# Patient Record
Sex: Male | Born: 1970 | Race: Black or African American | Hispanic: No | Marital: Single | Smoking: Never smoker
Health system: Southern US, Community
[De-identification: ages and names within clinical notes are randomized; demographics above are authoritative.]

## PROBLEM LIST (undated history)

## (undated) DIAGNOSIS — S82899A Other fracture of unspecified lower leg, initial encounter for closed fracture: Secondary | ICD-10-CM

## (undated) DIAGNOSIS — F32A Depression, unspecified: Secondary | ICD-10-CM

## (undated) DIAGNOSIS — I10 Essential (primary) hypertension: Secondary | ICD-10-CM

## (undated) DIAGNOSIS — K649 Unspecified hemorrhoids: Secondary | ICD-10-CM

## (undated) DIAGNOSIS — F329 Major depressive disorder, single episode, unspecified: Secondary | ICD-10-CM

## (undated) DIAGNOSIS — F419 Anxiety disorder, unspecified: Secondary | ICD-10-CM

## (undated) DIAGNOSIS — J45909 Unspecified asthma, uncomplicated: Secondary | ICD-10-CM

## (undated) DIAGNOSIS — J40 Bronchitis, not specified as acute or chronic: Secondary | ICD-10-CM

## (undated) DIAGNOSIS — K219 Gastro-esophageal reflux disease without esophagitis: Secondary | ICD-10-CM

## (undated) DIAGNOSIS — K5900 Constipation, unspecified: Secondary | ICD-10-CM

## (undated) HISTORY — PX: ANKLE SURGERY: SHX546

---
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
## 2007-07-16 ENCOUNTER — Emergency Department (HOSPITAL_COMMUNITY): Admission: EM | Admit: 2007-07-16 | Discharge: 2007-07-16 | Payer: Self-pay | Admitting: Emergency Medicine

## 2007-10-06 ENCOUNTER — Ambulatory Visit: Payer: Self-pay | Admitting: *Deleted

## 2007-10-06 ENCOUNTER — Ambulatory Visit: Payer: Self-pay | Admitting: Internal Medicine

## 2007-12-29 ENCOUNTER — Emergency Department (HOSPITAL_COMMUNITY): Admission: EM | Admit: 2007-12-29 | Discharge: 2007-12-29 | Payer: Self-pay | Admitting: Emergency Medicine

## 2008-01-03 ENCOUNTER — Ambulatory Visit: Payer: Self-pay | Admitting: Internal Medicine

## 2008-01-03 LAB — CONVERTED CEMR LAB
ALT: 28 units/L (ref 0–53)
AST: 20 units/L (ref 0–37)
BUN: 11 mg/dL (ref 6–23)
Basophils Relative: 1 % (ref 0–1)
CO2: 24 meq/L (ref 19–32)
Chloride: 107 meq/L (ref 96–112)
Eosinophils Absolute: 0.1 10*3/uL (ref 0.0–0.7)
Eosinophils Relative: 1 % (ref 0–5)
Glucose, Bld: 98 mg/dL (ref 70–99)
HCT: 45.2 % (ref 39.0–52.0)
HDL: 44 mg/dL (ref >39)
Hemoglobin: 14.8 g/dL (ref 13.0–17.0)
LDL Cholesterol: 124 mg/dL — ABNORMAL HIGH (ref 0–99)
Neutrophils Relative %: 64 % (ref 43–77)
Potassium: 4.5 meq/L (ref 3.5–5.3)
RBC: 5.01 M/uL (ref 4.22–5.81)
WBC: 6.7 10*3/uL (ref 4.0–10.5)

## 2008-02-29 ENCOUNTER — Ambulatory Visit: Payer: Self-pay | Admitting: Internal Medicine

## 2008-03-20 ENCOUNTER — Emergency Department (HOSPITAL_COMMUNITY): Admission: EM | Admit: 2008-03-20 | Discharge: 2008-03-20 | Payer: Self-pay | Admitting: Emergency Medicine

## 2008-05-05 ENCOUNTER — Emergency Department (HOSPITAL_COMMUNITY): Admission: EM | Admit: 2008-05-05 | Discharge: 2008-05-05 | Payer: Self-pay | Admitting: Emergency Medicine

## 2008-09-26 ENCOUNTER — Emergency Department (HOSPITAL_COMMUNITY): Admission: EM | Admit: 2008-09-26 | Discharge: 2008-09-26 | Payer: Self-pay | Admitting: Emergency Medicine

## 2009-03-20 ENCOUNTER — Ambulatory Visit: Payer: Self-pay | Admitting: Family Medicine

## 2009-03-20 ENCOUNTER — Encounter: Payer: Self-pay | Admitting: Internal Medicine

## 2009-03-20 DIAGNOSIS — K649 Unspecified hemorrhoids: Secondary | ICD-10-CM | POA: Insufficient documentation

## 2009-03-20 DIAGNOSIS — J309 Allergic rhinitis, unspecified: Secondary | ICD-10-CM | POA: Insufficient documentation

## 2009-05-22 ENCOUNTER — Encounter (INDEPENDENT_AMBULATORY_CARE_PROVIDER_SITE_OTHER): Payer: Self-pay | Admitting: *Deleted

## 2009-05-22 ENCOUNTER — Emergency Department (HOSPITAL_COMMUNITY): Admission: EM | Admit: 2009-05-22 | Discharge: 2009-05-23 | Payer: Self-pay | Admitting: Emergency Medicine

## 2009-05-23 ENCOUNTER — Encounter (INDEPENDENT_AMBULATORY_CARE_PROVIDER_SITE_OTHER): Payer: Self-pay | Admitting: *Deleted

## 2009-06-16 ENCOUNTER — Encounter (INDEPENDENT_AMBULATORY_CARE_PROVIDER_SITE_OTHER): Payer: Self-pay | Admitting: *Deleted

## 2009-06-20 ENCOUNTER — Ambulatory Visit: Payer: Self-pay | Admitting: Gastroenterology

## 2009-06-23 ENCOUNTER — Encounter: Payer: Self-pay | Admitting: Gastroenterology

## 2009-06-23 ENCOUNTER — Ambulatory Visit: Payer: Self-pay | Admitting: Gastroenterology

## 2009-06-26 ENCOUNTER — Encounter: Payer: Self-pay | Admitting: Gastroenterology

## 2010-01-02 ENCOUNTER — Encounter: Admission: RE | Admit: 2010-01-02 | Discharge: 2010-01-02 | Payer: Self-pay | Admitting: General Surgery

## 2010-01-29 ENCOUNTER — Ambulatory Visit (HOSPITAL_COMMUNITY): Admission: RE | Admit: 2010-01-29 | Discharge: 2010-01-29 | Payer: Self-pay | Admitting: Gastroenterology

## 2010-02-27 ENCOUNTER — Ambulatory Visit (HOSPITAL_COMMUNITY): Admission: RE | Admit: 2010-02-27 | Discharge: 2010-02-27 | Payer: Self-pay | Admitting: Gastroenterology

## 2010-05-04 ENCOUNTER — Emergency Department (HOSPITAL_COMMUNITY): Admission: EM | Admit: 2010-05-04 | Discharge: 2010-05-04 | Payer: Self-pay | Admitting: Emergency Medicine

## 2011-02-08 LAB — DIFFERENTIAL
Basophils Relative: 1 % (ref 0–1)
Eosinophils Relative: 2 % (ref 0–5)
Lymphs Abs: 2.9 10*3/uL (ref 0.7–4.0)
Monocytes Absolute: 0.7 10*3/uL (ref 0.1–1.0)
Monocytes Relative: 7 % (ref 3–12)

## 2011-02-08 LAB — BASIC METABOLIC PANEL
Creatinine, Ser: 1 mg/dL (ref 0.4–1.5)
GFR calc non Af Amer: >60 mL/min (ref >60)
Glucose, Bld: 86 mg/dL (ref 70–99)
Potassium: 3.9 mEq/L (ref 3.5–5.1)
Sodium: 142 mEq/L (ref 135–145)

## 2011-02-08 LAB — HEMOCCULT GUIAC POC 1CARD (OFFICE): Fecal Occult Bld: NEGATIVE

## 2011-02-08 LAB — CBC
MCV: 92.6 fL (ref 78.0–100.0)
RBC: 4.33 MIL/uL (ref 4.22–5.81)

## 2011-02-28 LAB — COMPREHENSIVE METABOLIC PANEL
AST: 46 U/L — ABNORMAL HIGH (ref 0–37)
Alkaline Phosphatase: 54 U/L (ref 39–117)
Chloride: 107 mEq/L (ref 96–112)
GFR calc non Af Amer: >60 mL/min (ref >60)
Potassium: 3.6 mEq/L (ref 3.5–5.1)

## 2011-02-28 LAB — URINALYSIS, ROUTINE W REFLEX MICROSCOPIC
Bilirubin Urine: NEGATIVE
Urobilinogen, UA: 0.2 mg/dL (ref 0.0–1.0)
pH: 6 (ref 5.0–8.0)

## 2011-02-28 LAB — CBC
HCT: 39.5 % (ref 39.0–52.0)
Hemoglobin: 13.4 g/dL (ref 13.0–17.0)
MCV: 92 fL (ref 78.0–100.0)
Platelets: 265 10*3/uL (ref 150–400)
RBC: 4.3 MIL/uL (ref 4.22–5.81)
RDW: 11.7 % (ref 11.5–15.5)

## 2011-02-28 LAB — POCT I-STAT, CHEM 8
Calcium, Ion: 1.19 mmol/L (ref 1.12–1.32)
Chloride: 106 mEq/L (ref 96–112)
Creatinine, Ser: 1.2 mg/dL (ref 0.4–1.5)
Glucose, Bld: 87 mg/dL (ref 70–99)
HCT: 42 % (ref 39.0–52.0)
Hemoglobin: 14.3 g/dL (ref 13.0–17.0)
Sodium: 145 mEq/L (ref 135–145)
TCO2: 27 mmol/L (ref 0–100)

## 2011-02-28 LAB — DIFFERENTIAL
Basophils Absolute: 0 10*3/uL (ref 0.0–0.1)
Lymphocytes Relative: 29 % (ref 12–46)
Monocytes Relative: 8 % (ref 3–12)
Neutro Abs: 4.6 10*3/uL (ref 1.7–7.7)

## 2011-03-22 ENCOUNTER — Emergency Department (HOSPITAL_COMMUNITY)
Admission: EM | Admit: 2011-03-22 | Discharge: 2011-03-22 | Disposition: A | Discharge status: Home / Self Care | Payer: Self-pay | Attending: Emergency Medicine | Admitting: Emergency Medicine

## 2011-03-22 DIAGNOSIS — N419 Inflammatory disease of prostate, unspecified: Secondary | ICD-10-CM | POA: Insufficient documentation

## 2011-06-13 ENCOUNTER — Emergency Department (HOSPITAL_COMMUNITY)
Admission: EM | Admit: 2011-06-13 | Discharge: 2011-06-14 | Disposition: A | Discharge status: Home / Self Care | Payer: Self-pay | Attending: Emergency Medicine | Admitting: Emergency Medicine

## 2011-06-13 DIAGNOSIS — E669 Obesity, unspecified: Secondary | ICD-10-CM | POA: Insufficient documentation

## 2011-06-13 DIAGNOSIS — H6120 Impacted cerumen, unspecified ear: Secondary | ICD-10-CM | POA: Insufficient documentation

## 2011-06-13 DIAGNOSIS — K219 Gastro-esophageal reflux disease without esophagitis: Secondary | ICD-10-CM | POA: Insufficient documentation

## 2011-06-13 DIAGNOSIS — I10 Essential (primary) hypertension: Secondary | ICD-10-CM | POA: Insufficient documentation

## 2011-06-13 DIAGNOSIS — J45909 Unspecified asthma, uncomplicated: Secondary | ICD-10-CM | POA: Insufficient documentation

## 2011-06-13 DIAGNOSIS — R21 Rash and other nonspecific skin eruption: Secondary | ICD-10-CM | POA: Insufficient documentation

## 2011-06-14 LAB — URINALYSIS, ROUTINE W REFLEX MICROSCOPIC
Bilirubin Urine: NEGATIVE
Glucose, UA: NEGATIVE mg/dL
Hgb urine dipstick: NEGATIVE
Specific Gravity, Urine: 1.031 — ABNORMAL HIGH (ref 1.005–1.030)
pH: 6 (ref 5.0–8.0)

## 2011-07-27 ENCOUNTER — Emergency Department (HOSPITAL_COMMUNITY)
Admission: EM | Admit: 2011-07-27 | Discharge: 2011-07-28 | Disposition: A | Discharge status: Home / Self Care | Payer: Self-pay | Attending: Emergency Medicine | Admitting: Emergency Medicine

## 2011-07-27 DIAGNOSIS — L298 Other pruritus: Secondary | ICD-10-CM | POA: Insufficient documentation

## 2011-07-27 DIAGNOSIS — F411 Generalized anxiety disorder: Secondary | ICD-10-CM | POA: Insufficient documentation

## 2011-07-27 DIAGNOSIS — L2989 Other pruritus: Secondary | ICD-10-CM | POA: Insufficient documentation

## 2011-07-27 DIAGNOSIS — K219 Gastro-esophageal reflux disease without esophagitis: Secondary | ICD-10-CM | POA: Insufficient documentation

## 2011-07-27 DIAGNOSIS — I10 Essential (primary) hypertension: Secondary | ICD-10-CM | POA: Insufficient documentation

## 2011-07-27 DIAGNOSIS — B354 Tinea corporis: Secondary | ICD-10-CM | POA: Insufficient documentation

## 2011-08-17 LAB — URINALYSIS, ROUTINE W REFLEX MICROSCOPIC
Glucose, UA: NEGATIVE
Hgb urine dipstick: NEGATIVE
Nitrite: NEGATIVE
Protein, ur: NEGATIVE
Specific Gravity, Urine: 1.024
Urobilinogen, UA: 0.2

## 2011-08-17 LAB — GC/CHLAMYDIA PROBE AMP, GENITAL
Chlamydia, DNA Probe: NEGATIVE
GC Probe Amp, Genital: NEGATIVE

## 2011-08-17 LAB — RPR: RPR Ser Ql: NONREACTIVE

## 2011-08-24 LAB — URINALYSIS, ROUTINE W REFLEX MICROSCOPIC
Hgb urine dipstick: NEGATIVE
Nitrite: NEGATIVE
Specific Gravity, Urine: 1.028
Urobilinogen, UA: 1

## 2011-08-24 LAB — DIFFERENTIAL
Basophils Absolute: 0.1
Lymphocytes Relative: 24
Lymphs Abs: 2.4
Monocytes Absolute: 1
Monocytes Relative: 10
Neutro Abs: 6.3

## 2011-08-24 LAB — COMPREHENSIVE METABOLIC PANEL
ALT: 23
BUN: 9
CO2: 27
Calcium: 9.2
Creatinine, Ser: 1.06
Glucose, Bld: 93
Potassium: 4.2
Sodium: 138
Total Bilirubin: 0.5
Total Protein: 6.7

## 2011-08-24 LAB — CBC: Hemoglobin: 14.5

## 2011-09-03 LAB — COMPREHENSIVE METABOLIC PANEL
AST: 26
Alkaline Phosphatase: 65
CO2: 30
Chloride: 104
Creatinine, Ser: 1.13
GFR calc Af Amer: >60
GFR calc non Af Amer: >60
Potassium: 3.9
Total Bilirubin: 0.6

## 2011-09-03 LAB — TSH: TSH: 0.624

## 2011-09-03 LAB — CBC
HCT: 42.2
MCV: 88.1
RBC: 4.79
WBC: 9.6

## 2011-09-03 LAB — DIFFERENTIAL
Basophils Absolute: 0.2 — ABNORMAL HIGH
Basophils Relative: 2 — ABNORMAL HIGH
Eosinophils Absolute: 0.2
Eosinophils Relative: 2
Lymphocytes Relative: 25

## 2011-10-29 ENCOUNTER — Emergency Department (HOSPITAL_COMMUNITY)
Admission: EM | Admit: 2011-10-29 | Discharge: 2011-10-29 | Disposition: A | Discharge status: Home / Self Care | Payer: Self-pay | Attending: Emergency Medicine | Admitting: Emergency Medicine

## 2011-10-29 ENCOUNTER — Encounter: Payer: Self-pay | Admitting: Physical Medicine and Rehabilitation

## 2011-10-29 DIAGNOSIS — R159 Full incontinence of feces: Secondary | ICD-10-CM | POA: Insufficient documentation

## 2011-10-29 DIAGNOSIS — R6889 Other general symptoms and signs: Secondary | ICD-10-CM | POA: Insufficient documentation

## 2011-10-29 DIAGNOSIS — R195 Other fecal abnormalities: Secondary | ICD-10-CM | POA: Insufficient documentation

## 2011-10-29 DIAGNOSIS — R109 Unspecified abdominal pain: Secondary | ICD-10-CM | POA: Insufficient documentation

## 2011-10-29 LAB — URINALYSIS, ROUTINE W REFLEX MICROSCOPIC
Bilirubin Urine: NEGATIVE
Ketones, ur: NEGATIVE mg/dL
Nitrite: NEGATIVE
Protein, ur: NEGATIVE mg/dL
Specific Gravity, Urine: 1.029 (ref 1.005–1.030)
Urobilinogen, UA: 1 mg/dL (ref 0.0–1.0)

## 2011-10-29 NOTE — ED Notes (Signed)
Pt presents to department for evaluation of abdominal pain, N/V and diarrhea. Pt states chronic problems with same. 10/10 abdominal cramping at the time. No blood in stool. Pt states pain and difficulty with defecation. Pt also reports decreased PO intake. Pt is conscious alert and oriented x4.

## 2011-10-29 NOTE — ED Notes (Addendum)
Pt had a POS hemoccult card, NOT pos preg test. Verified by Delila Pereyra, phlebotomist

## 2011-10-29 NOTE — ED Provider Notes (Signed)
History     CSN: [id number] Arrival date & time: 10/29/2011  5:32 PM   First MD Initiated Contact with Patient 10/29/11 2032      No chief complaint on file.   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  States he has intermittent abdominal cramping.  States his stool is dark black, "like charcoal," that smells strange. Pt states he has been seen by [facility] for this problem and has been given the name of a GI doctor but has just gotten insurance and has thus not yet been able to follow up.  Denies any changes in his chronic symptoms.  Denies fevers, N/V.  Has tried gold bond powder and baby wipes without improvement.  States the smell is interfering with his job.  Pt denies lightheadedness, SOB, weakness.    The history is provided by the patient.    History reviewed. No pertinent past medical history.  History reviewed. No pertinent past surgical history.  History reviewed. No pertinent family history.  History  Substance Use Topics  . Smoking status: Never Smoker   . Smokeless tobacco: Not on file  . Alcohol Use: No      Review of Systems  All other systems reviewed and are negative.    Allergies  Review of patient's allergies indicates no known allergies.  Home Medications  No current outpatient prescriptions on file.  BP 146/91  Pulse 58  Temp(Src) 98.1 F (36.7 C) (Oral)  Resp 17  SpO2 97%  Physical Exam  Nursing note and vitals reviewed. Constitutional: He is oriented to person, place, and time. He appears well-developed and well-nourished.  HENT:  Head: Normocephalic and atraumatic.  Neck: Neck supple.  Cardiovascular: Normal rate, regular rhythm and normal heart sounds.   Pulmonary/Chest: Breath sounds normal. No respiratory distress. He has no wheezes. He has no rales. He exhibits no tenderness.    Abdominal: Soft. Bowel sounds are normal. He exhibits no distension and no mass. There is tenderness. There is no rebound and no guarding.         obese  Genitourinary: Rectal exam shows no mass, no tenderness and anal tone normal. Guaiac positive stool.       Stool in rectal vault is green, liquid.  No frank blood or melanic stool.   Musculoskeletal:       Spine nontender.  Strength 5/5 lower extremities, sensation in lower extremities intact.    Neurological: He is alert and oriented to person, place, and time.    ED Course  Procedures (including critical care time)  Hemoccult positive, recorded erroneously in chart as positive pregnancy test.    Discussed patient with Dr Weldon Inches.  Patient with chronic, unchanged symptoms, no symptoms of symptomatic anemia.    Labs Reviewed  URINALYSIS, ROUTINE W REFLEX MICROSCOPIC  POCT PREGNANCY, URINE  POCT OCCULT BLOOD STOOL, DEVICE   No results found.   1. Occult GI bleeding       MDM  Patient with chronic, unchanged symptoms of rectal leaking and foul smelling stools x 2-3 years.  Patient's stool is hemoccult positive but without signs or symptoms of anemia.  Patient has been seen by [facility] many times for this previously but has not yet seen a specialist.  Have given GI follow up, reasons for immediate return.  Pt verbalizes understanding.          Rise Patience, [place] 10/29/11 2316

## 2011-10-29 NOTE — ED Notes (Addendum)
Pt reports diarrhea-like "leakage" from anus x2 years - pt states his employer told him his co-workers do not want to be around him due to the smell and needed to see a physician about his condition. Pt presents tonight w/ mild lower abd tenderness, worse w/ palpation. Pt denies recent n/v however admits to x1 episode "a few days ago" - pt reports diarrhea appears "dark grey color." Pt denies any anal intercourse.

## 2011-10-30 NOTE — ED Provider Notes (Signed)
Medical screening examination/treatment/procedure(s) were performed by non-physician practitioner and as supervising physician I was immediately available for consultation/collaboration.  Evert Wenrich P Giuseppina Quinones, MD 10/30/11 1509 

## 2012-01-18 ENCOUNTER — Other Ambulatory Visit: Payer: Self-pay | Admitting: Gastroenterology

## 2012-02-17 ENCOUNTER — Encounter (HOSPITAL_COMMUNITY): Payer: Self-pay

## 2012-02-17 ENCOUNTER — Emergency Department (HOSPITAL_COMMUNITY): Payer: BC Managed Care – PPO

## 2012-02-17 ENCOUNTER — Emergency Department (HOSPITAL_COMMUNITY)
Admission: EM | Admit: 2012-02-17 | Discharge: 2012-02-17 | Disposition: A | Discharge status: Home / Self Care | Payer: BC Managed Care – PPO | Attending: Emergency Medicine | Admitting: Emergency Medicine

## 2012-02-17 DIAGNOSIS — J020 Streptococcal pharyngitis: Secondary | ICD-10-CM

## 2012-02-17 DIAGNOSIS — B353 Tinea pedis: Secondary | ICD-10-CM | POA: Insufficient documentation

## 2012-02-17 DIAGNOSIS — R0981 Nasal congestion: Secondary | ICD-10-CM

## 2012-02-17 DIAGNOSIS — R05 Cough: Secondary | ICD-10-CM | POA: Insufficient documentation

## 2012-02-17 DIAGNOSIS — L22 Diaper dermatitis: Secondary | ICD-10-CM | POA: Insufficient documentation

## 2012-02-17 DIAGNOSIS — J3489 Other specified disorders of nose and nasal sinuses: Secondary | ICD-10-CM | POA: Insufficient documentation

## 2012-02-17 DIAGNOSIS — R059 Cough, unspecified: Secondary | ICD-10-CM | POA: Insufficient documentation

## 2012-02-17 DIAGNOSIS — R0982 Postnasal drip: Secondary | ICD-10-CM | POA: Insufficient documentation

## 2012-02-17 DIAGNOSIS — J069 Acute upper respiratory infection, unspecified: Secondary | ICD-10-CM

## 2012-02-17 MED ORDER — ALBUTEROL SULFATE HFA 108 (90 BASE) MCG/ACT IN AERS: 2.0000 | INHALATION_SPRAY | RESPIRATORY_TRACT | Status: DC | PRN

## 2012-02-17 MED ORDER — PENICILLIN G BENZATHINE 1200000 UNIT/2ML IM SUSP
1.2000 10*6.[IU] | INTRAMUSCULAR | Status: AC
  Administered 2012-02-17: 1.2 10*6.[IU] via INTRAMUSCULAR
  Filled 2012-02-17 (×2): qty 2

## 2012-02-17 MED ORDER — CETIRIZINE-PSEUDOEPHEDRINE ER 5-120 MG PO TB12: 1.0000 | ORAL_TABLET | Freq: Two times a day (BID) | ORAL | Status: DC | PRN

## 2012-02-17 MED ORDER — DERMAGRAN BC EX CREA: TOPICAL_CREAM | CUTANEOUS | Status: DC | PRN

## 2012-02-17 MED ORDER — MICONAZOLE NITRATE 2 % EX CREA: TOPICAL_CREAM | Freq: Two times a day (BID) | CUTANEOUS | Status: DC

## 2012-02-17 MED ORDER — HYDROCODONE-ACETAMINOPHEN 5-325 MG PO TABS: 2.0000 | ORAL_TABLET | ORAL | Status: AC | PRN

## 2012-02-17 NOTE — Discharge Instructions (Signed)
Athlete's Foot  Athlete's foot is a skin infection caused by a fungus. Athlete's foot is often seen between or under the toes. It can also be seen on the bottom of the foot. Athlete's foot can spread to other people by sharing towels or shower stalls. HOME CARE  Only take medicines as told by your doctor. Do not use steroid creams.   Wash your feet daily. Dry your feet well, especially between the toes.   Change your socks every day. Wear cotton or wool socks.   Change your socks 2 to 3 times a day in hot weather.   Wear sandals or canvas tennis shoes with good airflow.   If you have blisters, soak your feet in a solution as told by your doctor. Do this for 20 to 30 minutes, 2 times a day. Dry your feet well after you soak them.   Do not share towels.   Wear sandals when you use shared locker rooms or showers.  GET HELP RIGHT AWAY IF:   You have a fever.   Your foot is puffy (swollen), sore, warm, or red.   You are not getting better after 7 days of treatment.   You still have athlete's foot after 30 days.   You have problems caused by your medicine.  MAKE SURE YOU:   Understand these instructions.   Will watch your condition.   Will get help right away if you are not doing well or get worse.  Document Released: 04/26/2008 Document Revised: 10/28/2011 Document Reviewed: 08/27/2011 [facility] Patient Information 2012 [place], [place].Contact Dermatitis Contact dermatitis is a rash that happens when something touches the skin. You touched something that irritates your skin, or you have allergies to something you touched. HOME CARE   Avoid the thing that caused your rash.   Keep your rash away from hot water, soap, sunlight, chemicals, and other things that might bother it.   Do not scratch your rash.   You can take cool baths to help stop itching.   Only take medicine as told by your doctor.   Keep all doctor visits as told.  GET HELP RIGHT AWAY IF:   Your rash is  not better after 3 days.   Your rash gets worse.   Your rash is puffy (swollen), tender, red, sore, or warm.   You have problems with your medicine.  MAKE SURE YOU:   Understand these instructions.   Will watch your condition.   Will get help right away if you are not doing well or get worse.  Document Released: 09/05/2009 Document Revised: 10/28/2011 Document Reviewed: 04/13/2011 [facility] Patient Information 2012 [place], [place].Diaper Rash Your caregiver has diagnosed your baby as having diaper rash. CAUSES  Diaper rash can have a number of causes. The baby's bottom is often wet, so the skin there becomes soft and damaged. It is more susceptible to inflammation (irritation) and infections. This process is caused by the constant contact with:  Urine.   Fecal material.   Retained diaper soap.   Yeast.   Germs (bacteria).  TREATMENT   If the rash has been diagnosed as a recurrent yeast infection (monilia), an antifungal agent such as Monistat cream will be useful.   If the caregiver decides the rash is caused by a yeast or bacterial (germ) infection, he may prescribe an appropriate ointment or cream. If this is the case today:   Use the cream or ointment 3 times per day, unless otherwise directed.   Change the diaper  whenever the baby is wet or soiled.   Leaving the diaper off for brief periods of time will also help.  HOME CARE INSTRUCTIONS  Most diaper rash responds readily to simple measures.   Just changing the diapers frequently will allow the skin to become healthier.   Using more absorbent diapers will keep the baby's bottom dryer.   Each diaper change should be accompanied by washing the baby's bottom with warm soapy water. Dry it thoroughly. Make sure no soap remains on the skin.   Over the counter ointments such as A&D, petrolatum and zinc oxide paste may also prove useful. Ointments, if available, are generally less irritating than creams. Creams may  produce a burning feeling when applied to irritated skin.  SEEK MEDICAL CARE IF:  The rash has not improved in 2 to 3 days, or if the rash gets worse. You should make an appointment to see your baby's caregiver. SEEK IMMEDIATE MEDICAL CARE IF:  A fever develops over 100.4 F (38.0 C) or as your caregiver suggests. MAKE SURE YOU:   Understand these instructions.   Will watch your condition.   Will get help right away if you are not doing well or get worse.  Document Released: 11/05/2000 Document Revised: 10/28/2011 Document Reviewed: 06/13/2008 [facility] Patient Information 2012 [place], [place].Cough, Adult  A cough is a reflex. It helps you clear your throat and airways. A cough can help heal your body. A cough can last 2 or 3 weeks (acute) or may last more than 8 weeks (chronic). Some common causes of a cough can include an infection, allergy, or a cold. HOME CARE  Only take medicine as told by your doctor.   If given, take your medicines (antibiotics) as told. Finish them even if you start to feel better.   Use a cold steam vaporizer or humidier in your home. This can help loosen thick spit (secretions).   Sleep so you are almost sitting up (semi-upright). Use pillows to do this. This helps reduce coughing.   Rest as needed.   Stop smoking if you smoke.  GET HELP RIGHT AWAY IF:  You have yellowish-white fluid (pus) in your thick spit.   Your cough gets worse.   Your medicine does not reduce coughing, and you are losing sleep.   You cough up blood.   You have trouble breathing.   Your pain gets worse and medicine does not help.   You have a fever.  MAKE SURE YOU:   Understand these instructions.   Will watch your condition.   Will get help right away if you are not doing well or get worse.  Document Released: 07/22/2011 Document Revised: 10/28/2011 Document Reviewed: 07/22/2011 [facility] Patient Information 2012 [place], [place].Fungus Infection of the Skin An  infection of your skin caused by a fungus is a very common problem. Treatment depends on which part of the body is affected. Types of fungal skin infection include:  Athlete's Foot(Tinea pedis). This infection starts between the toes and may involve the entire sole and sides of foot. It is the most common fungal disease. It is made worse by heat, moisture, and friction. To treat, wash your feet 2 to 3 times daily. Dry thoroughly between the toes. Use medicated foot powder or cream as directed on the package. Plain talc, cornstarch, or rice powder may be dusted into socks and shoes to keep the feet dry. Wearing footwear that allows ventilation is also helpful.   Ringworm (Tinea corporis and tinea capitis). This  infection causes scaly red rings to form on the skin or scalp. For skin sores, apply medicated lotion or cream as directed on the package. For the scalp, medicated shampoo may be used with with other therapies. Ringworm of the scalp or fingernails usually requires using oral medicine for 2 to 4 months.   Tinea versicolor. This infection appears as painless, scaly, patchy areas of discolored skin (whitish to light brown). It is more common in the summer and favors oily areas of the skin such as those found at the chest, abdomen, back, pubis, neck, and body folds. It can be treated with medicated shampoo or with medicated topical cream. Oral antifungals may be needed for more active infections. The light and/or dark spots may take time to get better and is not a sign of treatment failure.  Fungal infections may need to be treated for several weeks to be cured. It is important not to treat fungal infections with steroids or combination medicine that contains an antifungal and steroid as these will make the fungal infection worse. SEEK MEDICAL CARE IF:   You have persistent itching or rawness.   You have an oral temperature above 102 F (38.9 C).  Document Released: 12/16/2004 Document Revised:  10/28/2011 Document Reviewed: 03/03/2010 [facility] Patient Information 2012 [place], [place].Pharyngitis, Viral and Bacterial Pharyngitis is soreness (inflammation) or infection of the pharynx. It is also called a sore throat. CAUSES  Most sore throats are caused by viruses and are part of a cold. However, some sore throats are caused by strep and other bacteria. Sore throats can also be caused by post nasal drip from draining sinuses, allergies and sometimes from sleeping with an open mouth. Infectious sore throats can be spread from person to person by coughing, sneezing and sharing cups or eating utensils. TREATMENT  Sore throats that are viral usually last 3-4 days. Viral illness will get better without medications (antibiotics). Strep throat and other bacterial infections will usually begin to get better about 24-48 hours after you begin to take antibiotics. HOME CARE INSTRUCTIONS   If the caregiver feels there is a bacterial infection or if there is a positive strep test, they will prescribe an antibiotic. The full course of antibiotics must be taken. If the full course of antibiotic is not taken, you or your child may become ill again. If you or your child has strep throat and do not finish all of the medication, serious heart or kidney diseases may develop.   Drink enough water and fluids to keep your urine clear or pale yellow.   Only take over-the-counter or prescription medicines for pain, discomfort or fever as directed by your caregiver.   Get lots of rest.   Gargle with salt water ( tsp. of salt in a glass of water) as often as every 1-2 hours as you need for comfort.   Hard candies may soothe the throat if individual is not at risk for choking. Throat sprays or lozenges may also be used.  SEEK MEDICAL CARE IF:   Large, tender lumps in the neck develop.   A rash develops.   Green, yellow-brown or bloody sputum is coughed up.   Your baby is older than 3 months with a rectal  temperature of 100.5 F (38.1 C) or higher for more than 1 day.  SEEK IMMEDIATE MEDICAL CARE IF:   A stiff neck develops.   You or your child are drooling or unable to swallow liquids.   You or your child are vomiting,  unable to keep medications or liquids down.   You or your child has severe pain, unrelieved with recommended medications.   You or your child are having difficulty breathing (not due to stuffy nose).   You or your child are unable to fully open your mouth.   You or your child develop redness, swelling, or severe pain anywhere on the neck.   You have a fever.   Your baby is older than 3 months with a rectal temperature of 102 F (38.9 C) or higher.   Your baby is 4 months old or younger with a rectal temperature of 100.4 F (38 C) or higher.  MAKE SURE YOU:   Understand these instructions.   Will watch your condition.   Will get help right away if you are not doing well or get worse.  Document Released: 11/08/2005 Document Revised: 10/28/2011 Document Reviewed: 02/05/2008 [facility] Patient Information 2012 [place], [place].Saline Nose Drops  To help clear a stuffy nose, put salt water (saline) nose drops in your infant's nose. This helps to loosen the secretions in the nose. Use a bulb syringe to clean the nose out:  Before feeding.   Before putting your infant down for naps.   No more than once every 3 hours to avoid irritating your infant's nostrils.  HOME CARE  Buy nose drops at your local drug store. You can also make nose drops yourself. Mix 1 cup of water with  teaspoon of salt. Stir. Store this mixture at room temperature. Make a new batch daily.   To use the drops:   Put 1 or 2 drops in each side of infant's nose with a clean medicine dropper. Do not use this dropper for any other medicine.   Squeeze the air out of the suction bulb before inserting it into your infant's nose.   While still squeezing the bulb flat, place the tip of the bulb into  a nostril. Let air come back into the bulb. The suction will pull snot out of the nose and into the bulb.   Repeat on other nostril.   Squeeze the bulb several times into a tissue and wash the bulb tip in soapy water. Store the bulb with the tip side down on paper towel.   Use the bulb syringe with only the saline drops to avoid irritating your infant's nostrils.  GET HELP RIGHT AWAY IF:  The snot changes to green or yellow.   The snot gets thicker.   Your infant is 3 months or younger with a rectal temperature of 100.4 F (38 C) or higher.   Your infant is older than 3 months with a rectal temperature of 102 F (38.9 C) or higher.   The stuffy nose lasts 10 days or longer.   There is trouble breathing or feeding.  MAKE SURE YOU:  Understand these instructions.   Will watch your infant's condition.   Will get help right away if your infant is not doing well or gets worse.  Document Released: 09/05/2009 Document Revised: 10/28/2011 Document Reviewed: 09/05/2009 [facility] Patient Information 2012 [place], [place].Salt Water Gargle This solution will help make your mouth and throat feel better. HOME CARE INSTRUCTIONS   Mix 1 teaspoon of salt in 8 ounces of warm water.   Gargle with this solution as much or often as you need or as directed. Swish and gargle gently if you have any sores or wounds in your mouth.   Do not swallow this mixture.  Document Released: 08/12/2004 Document  Revised: 10/28/2011 Document Reviewed: 01/03/2009 [facility] Patient Information 2012 [place], [place].Strep Throat Strep throat is an infection of the throat. It is caused by a germ. Strep throat spreads from person to person by coughing, sneezing, or close contact. HOME CARE  Rinse your mouth (gargle) with warm salt water (1 teaspoon salt in 1 cup of water). Do this 3 to 4 times per day or as needed for comfort.   Family members with a sore throat or fever should see a doctor.   Make sure everyone in  your house washes their hands well.   Do not share food, drinking cups, or personal items.   Eat soft foods until your sore throat gets better.   Drink enough water and fluids to keep your pee (urine) clear or pale yellow.   Rest.   Stay home from school, daycare, or work until you have taken medicine for 24 hours.   Only take medicine as told by your doctor.   Take your medicine as told. Finish it even if you start to feel better.  GET HELP RIGHT AWAY IF:   You have new problems, such as throwing up (vomiting) or bad headaches.   You have a stiff or painful neck, chest pain, trouble breathing, or trouble swallowing.   You have very bad throat pain, drooling, or changes in your voice.   Your neck puffs up (swells) or gets red and tender.   You have a fever.   You are very tired, your mouth is dry, or you are peeing less than normal.   You cannot wake up completely.   You get a rash, cough, or earache.   You have green, yellow-brown, or bloody spit.   Your pain does not get better with medicine.  MAKE SURE YOU:   Understand these instructions.   Will watch your condition.   Will get help right away if you are not doing well or get worse.  Document Released: 04/26/2008 Document Revised: 10/28/2011 Document Reviewed: 01/07/2011  RESOURCE GUIDE  Dental Problems  Patients with Medicaid: [facility] [phone] [address]                                           [phone] [address] Phone:  [phone]                                                  Phone:  [phone]  If unable to pay or uninsured, contact:  [facility] or [facility] [place]. to become qualified for the adult dental clinic.  Chronic Pain Problems Contact [facility]  [phone] Patients need to be referred by their primary care doctor.  Insufficient Money for Medicine Contact [employer]:  call "211" or [facility] [phone].  No Primary Care Doctor Call [facility]  [phone] Other agencies that provide inexpensive medical care    [facility]  [phone]    [facility]  [phone]    [facility]  [phone]    [facility]  [phone]    [facility]  [facility]  [phone]    [facility]  [phone]  Psychological Services [facility]  [phone] [facility]  [phone] [facility]   [phone] (emergency services [phone])  Substance Abuse Resources [facility]  ([phone] [facility] [phone] [facility] [place] [phone] Floydene Flock [phone] Residential & Outpatient Substance Abuse Program  [phone]  Abuse/Neglect [facility] Child Abuse Hotline (808)537-8107 [facility] Child Abuse Hotline 8562273194 (After Hours)  Emergency Shelter [facility] [phone]  Maternity Homes [facility] [place] [facility] [phone] [facility] 9284109442  MRSA Hotline #:   539-497-5167    [facility] Resources  [facility] [place]     [employer]                          [facility] Dept. [address] [place]                       [address]      [address]  Blondell Reveal Phone:  [phone]                                   Phone:  [phone]                 Phone:  [phone]  [facility] Phone:  [phone]  [facility] Child Abuse Hotline 2263733083 6208636892 (After Hours)   [facility] Patient Information 2012 [place], [place].

## 2012-02-17 NOTE — ED Provider Notes (Signed)
History     CSN: [id number]  Arrival date & time 02/17/12  1426   First MD Initiated Contact with Patient 02/17/12 1530      Chief Complaint  Patient presents with  . Abdominal Pain    (Consider location/radiation/quality/duration/timing/severity/associated sxs/prior treatment) HPI Comments: Upper respiratory tract infection:  The patient is a 41 year old male who presents for evaluation of several days (approximately 5) of nasal congestion, rhinorrhea, purulent nasal discharge, postnasal drip, sore throat , cough, reportedly productive of yellow sputum. The sore throat is moderate in intensity. He has no lymphadenopathy, no dysphonia, no difficulty breathing or swallowing.  He is handling his secretions well without any drooling. He denies any rash, ear pain, vision change, eye pain, neck pain or stiffness. He reports subjective fever and chills. He denies chest pain or dyspnea and has had no wheezing. The patient does have a history of asthma.  Skin problem:  The patient reports 3-4 days of irritation within the gluteal cleft and the folds of skin between the buttocks that is made worse with wiping his bottom after a bowel movement.  He also reports pruritus, thickening and erythema of skin between his toes on both feet. This has been going on for several weeks.  Constipation/Abdominal pain:  Despite the documentation by the triage nurse, the patient is not having abdominal pain and had a bowel movement just several minutes ago, this morning, while awaiting triage in the emergency department. He denies diarrhea, denies any blood or mucus in the stool.  The history is provided by the patient.    Past Medical History  Diagnosis Date  . Arthritis   . Seasonal allergies     History reviewed. No pertinent past surgical history.  No family history on file.  History  Substance Use Topics  . Smoking status: Never Smoker   . Smokeless tobacco: Not on file  . Alcohol Use: No       Review of Systems  Constitutional: Positive for fever, chills and fatigue. Negative for diaphoresis, activity change, appetite change and unexpected weight change.  HENT: Positive for congestion, sore throat, rhinorrhea and postnasal drip. Negative for hearing loss, ear pain, nosebleeds, facial swelling, sneezing, drooling, mouth sores, trouble swallowing, neck pain, neck stiffness, dental problem, voice change, sinus pressure, tinnitus and ear discharge.   Eyes: Negative for photophobia, pain, discharge, redness and itching.  Respiratory: Positive for cough. Negative for choking, chest tightness, shortness of breath, wheezing and stridor.   Cardiovascular: Negative for chest pain, palpitations and leg swelling.  Gastrointestinal: Negative for nausea, vomiting, abdominal pain, diarrhea, constipation, blood in stool, abdominal distention and anal bleeding.  Genitourinary: Negative for dysuria, urgency, frequency, hematuria, flank pain and difficulty urinating.  Musculoskeletal: Negative for myalgias, back pain, joint swelling, arthralgias and gait problem.  Skin: Positive for rash. Negative for color change, pallor and wound.  Neurological: Negative for dizziness, weakness, light-headedness and headaches.  Hematological: Negative for adenopathy. Does not bruise/bleed easily.  Psychiatric/Behavioral: Negative.     Allergies  Review of patient's allergies indicates no known allergies.  Home Medications   Current Outpatient Rx  Name Route Sig Dispense Refill  . ALBUTEROL SULFATE HFA 108 (90 BASE) MCG/ACT IN AERS Inhalation Inhale 2 puffs into the lungs every 6 (six) hours as needed.    Marland Kitchen OVER THE COUNTER MEDICATION Oral Take 1 capsule by mouth daily. stool softner    . OVER THE COUNTER MEDICATION Oral Take 1 tablet by mouth 2 (two) times daily. Over the counter  Diet pill    . OVER THE COUNTER MEDICATION Oral Take 1 tablet by mouth daily. Over the counter allergy medication      BP  133/62  Pulse 91  Temp 98.4 F (36.9 C)  Resp 20  SpO2 97%  Physical Exam  Nursing note and vitals reviewed. Constitutional: He is oriented to person, place, and time. He appears well-developed and well-nourished. He appears distressed.  HENT:  Head: Normocephalic and atraumatic. Head is without right periorbital erythema and without left periorbital erythema.  Right Ear: Hearing, tympanic membrane, external ear and ear canal normal.  Left Ear: Hearing, tympanic membrane, external ear and ear canal normal.  Nose: Mucosal edema and rhinorrhea present. Right sinus exhibits no maxillary sinus tenderness and no frontal sinus tenderness. Left sinus exhibits no maxillary sinus tenderness and no frontal sinus tenderness.  Mouth/Throat: Uvula is midline and mucous membranes are normal. No oral lesions. Posterior oropharyngeal erythema present. No oropharyngeal exudate, posterior oropharyngeal edema or tonsillar abscesses.  Eyes: Conjunctivae and EOM are normal.  Neck: Normal range of motion. No JVD present. No tracheal deviation present.  Cardiovascular: Normal rate, regular rhythm, normal heart sounds and intact distal pulses.  Exam reveals no gallop and no friction rub.   No murmur heard. Pulmonary/Chest: Effort normal and breath sounds normal. No respiratory distress. He has no wheezes. He has no rales. He exhibits no tenderness.  Abdominal: Soft. Bowel sounds are normal. He exhibits no distension. There is no tenderness. There is no rebound and no guarding.  Musculoskeletal: Normal range of motion. He exhibits no edema and no tenderness.  Lymphadenopathy:    He has no cervical adenopathy.  Neurological: He is alert and oriented to person, place, and time. He has normal reflexes. No cranial nerve deficit. He exhibits normal muscle tone. Coordination normal.  Skin: Skin is warm and dry. Rash noted. He is not diaphoretic. There is erythema. No pallor.       The patient has mild erythema and  induration of the skin between the buttocks in the superior gluteal cleft consistent with diaper dermatitis, or contact dermatitis. I instructed the patient about proper hygiene after bowel movements and with bathing, and will recommend a zinc oxide ointment. There is no apparent cellulitis or abscess here.  Between the patient's toes on the dorsum of the feet there is mild erythema, lichenification and thickening of the skin, it is dry, without purpura, blisters, or weeping, and the skin is intact. This appears compatible with athlete's foot or tinea pedis.  Psychiatric: He has a normal mood and affect. His behavior is normal. Judgment and thought content normal.    ED Course  Procedures (including critical care time)  Labs Reviewed  RAPID STREP SCREEN - Abnormal; Notable for the following:    Streptococcus, Group A Screen (Direct) POSITIVE (*)    All other components within normal limits   Dg Chest 2 View  02/17/2012  *RADIOLOGY REPORT*  Clinical Data: Productive cough.  Evaluate for pneumonia.  CHEST - 2 VIEW  Comparison: None  Findings:  The heart size and mediastinal contours are within normal limits.  Both lungs are clear.  The visualized skeletal structures are unremarkable.  IMPRESSION: No active cardiopulmonary disease.  Original Report Authenticated By: Elsie Stain, M.D.     No diagnosis found.    MDM  The patient appears to have a viral upper respiratory tract infection with nasal congestion and postnasal drip, and his cough is thought likely due to to post  nasal drip as his chest x-ray is normal and his lungs are clear to auscultation without any dyspnea or hypoxia. He is not having any wheezing or signs of asthma exacerbation. The patient surprisingly does, positive for streptococcal pharyngitis. I have treated him in the emergency department with a one-time injection of penicillin IM. Otherwise, he appears to have contact dermatitis of the gluteal cleft, and athlete's foot or  tinea pedis. I will treat accordingly.        Felisa Bonier, MD 02/17/12 [phone]

## 2012-02-17 NOTE — ED Notes (Signed)
Pt ambulatory to x-ray at this tiime.

## 2012-02-17 NOTE — ED Notes (Signed)
Pt c/o congestion, sore throat and feeling cold for 3 days.   Nausea without vomiting

## 2012-02-17 NOTE — ED Notes (Signed)
Pt presents with multiple complaints.  Pt reports nasal congestion and productive cough with yellow phlegm x 3 days.  +sore throat.  Pt reports abdominal cramping, reports no bowel movement x 4 days, but is able to pass gas.  Pt reports rash to perineal area x 4 days.  -nausea

## 2012-03-07 ENCOUNTER — Encounter (HOSPITAL_COMMUNITY): Payer: Self-pay | Admitting: Emergency Medicine

## 2012-03-07 ENCOUNTER — Emergency Department (HOSPITAL_COMMUNITY)
Admission: EM | Admit: 2012-03-07 | Discharge: 2012-03-07 | Disposition: A | Discharge status: Home / Self Care | Payer: BC Managed Care – PPO | Attending: Emergency Medicine | Admitting: Emergency Medicine

## 2012-03-07 DIAGNOSIS — J309 Allergic rhinitis, unspecified: Secondary | ICD-10-CM | POA: Insufficient documentation

## 2012-03-07 DIAGNOSIS — R05 Cough: Secondary | ICD-10-CM | POA: Insufficient documentation

## 2012-03-07 DIAGNOSIS — I10 Essential (primary) hypertension: Secondary | ICD-10-CM | POA: Insufficient documentation

## 2012-03-07 DIAGNOSIS — R6889 Other general symptoms and signs: Secondary | ICD-10-CM | POA: Insufficient documentation

## 2012-03-07 DIAGNOSIS — M129 Arthropathy, unspecified: Secondary | ICD-10-CM | POA: Insufficient documentation

## 2012-03-07 DIAGNOSIS — R059 Cough, unspecified: Secondary | ICD-10-CM | POA: Insufficient documentation

## 2012-03-07 DIAGNOSIS — K5900 Constipation, unspecified: Secondary | ICD-10-CM | POA: Insufficient documentation

## 2012-03-07 HISTORY — DX: Essential (primary) hypertension: I10

## 2012-03-07 NOTE — Discharge Instructions (Signed)
Constipation in Adults Constipation is having fewer than 2 bowel movements per week. Usually, the stools are hard. As we grow older, constipation is more common. If you try to fix constipation with laxatives, the problem may get worse. This is because laxatives taken over a long period of time make the colon muscles weaker. A low-fiber diet, not taking in enough fluids, and taking some medicines may make these problems worse. MEDICATIONS THAT MAY CAUSE CONSTIPATION  Water pills (diuretics).   Calcium channel blockers (used to control blood pressure and for the heart).   Certain pain medicines (narcotics).   Anticholinergics.   Anti-inflammatory agents.   Antacids that contain aluminum.  DISEASES THAT CONTRIBUTE TO CONSTIPATION  Diabetes.   Parkinson's disease.   Dementia.   Stroke.   Depression.   Illnesses that cause problems with salt and water metabolism.  HOME CARE INSTRUCTIONS   Constipation is usually best cared for without medicines. Increasing dietary fiber and eating more fruits and vegetables is the best way to manage constipation.   Slowly increase fiber intake to 25 to 38 grams per day. Whole grains, fruits, vegetables, and legumes are good sources of fiber. A dietitian can further help you incorporate high-fiber foods into your diet.   Drink enough water and fluids to keep your urine clear or pale yellow.   A fiber supplement may be added to your diet if you cannot get enough fiber from foods.   Increasing your activities also helps improve regularity.   Suppositories, as suggested by your caregiver, will also help. If you are using antacids, such as aluminum or calcium containing products, it will be helpful to switch to products containing magnesium if your caregiver says it is okay.   If you have been given a liquid injection (enema) today, this is only a temporary measure. It should not be relied on for treatment of longstanding (chronic) constipation.    Stronger measures, such as magnesium sulfate, should be avoided if possible. This may cause uncontrollable diarrhea. Using magnesium sulfate may not allow you time to make it to the bathroom.  SEEK IMMEDIATE MEDICAL CARE IF:   There is bright red blood in the stool.   The constipation stays for more than 4 days.   There is belly (abdominal) or rectal pain.   You do not seem to be getting better.   You have any questions or concerns.  MAKE SURE YOU:   Understand these instructions.   Will watch your condition.   Will get help right away if you are not doing well or get worse.  Document Released: 08/06/2004 Document Revised: 10/28/2011 Document Reviewed: 10/12/2011 [facility] Patient Information 2012 [place], [place].   Tried milk of magnesia as directed for constipation Call [facility] tomorrow to get a primary care Doctor

## 2012-03-07 NOTE — ED Provider Notes (Addendum)
History     CSN: [id number]  Arrival date & time 03/07/12  1618   First MD Initiated Contact with Patient 03/07/12 1830      Chief Complaint  Patient presents with  . Rectal Pain  . Dental Pain    (Consider location/radiation/quality/duration/timing/severity/associated sxs/prior treatment) HPI Complains of hard bowel movements for several months also complains of bad breath for several months denies dental pain. No treatment prior to coming here has pain with bowel movements at penis no other complaint no anorexia no bowel pain no fever last bowel movement approximately 5 days ago which was hard no blood per rectum no other complaint Past Medical History  Diagnosis Date  . Arthritis   . Seasonal allergies   . Hypertension     History reviewed. No pertinent past surgical history.  History reviewed. No pertinent family history.  History  Substance Use Topics  . Smoking status: Never Smoker   . Smokeless tobacco: Not on file  . Alcohol Use: No      Review of Systems  HENT:       Halitosis  Gastrointestinal: Positive for constipation.  All other systems reviewed and are negative.    Allergies  Review of patient's allergies indicates no known allergies.  Home Medications   Current Outpatient Rx  Name Route Sig Dispense Refill  . ALBUTEROL SULFATE HFA 108 (90 BASE) MCG/ACT IN AERS Inhalation Inhale 2 puffs into the lungs every 4 (four) hours as needed. For wheezing    . PRAMOXINE HCL 1 % RE OINT Rectal Place 1 application rectally every 2 (two) hours as needed. For hemorrhoids    . DISPOSABLE ENEMA 19-7 GM/118ML RE ENEM Rectal Place 1 enema rectally once. follow package directions      BP 133/85  Pulse 94  Temp(Src) 98.1 F (36.7 C) (Oral)  Resp 16  SpO2 97%  Physical Exam  Nursing note and vitals reviewed. Constitutional: He appears well-developed and well-nourished.  HENT:  Head: Normocephalic and atraumatic.  Right Ear: External ear normal.  Left  Ear: External ear normal.  Mouth/Throat: Oropharynx is clear and moist.       No halitosis  Eyes: Conjunctivae are normal. Pupils are equal, round, and reactive to light.  Neck: Neck supple. No tracheal deviation present. No thyromegaly present.  Cardiovascular: Normal rate and regular rhythm.   No murmur heard. Pulmonary/Chest: Effort normal and breath sounds normal.  Abdominal: Soft. Bowel sounds are normal. He exhibits no distension. There is no tenderness.       Obese  Genitourinary: Rectum normal and prostate normal. Guaiac negative stool.  Musculoskeletal: Normal range of motion. He exhibits no edema and no tenderness.  Neurological: He is alert. Coordination normal.  Skin: Skin is warm and dry. No rash noted.  Psychiatric: He has a normal mood and affect.   Results for orders placed during the hospital encounter of 03/07/12  OCCULT BLOOD, POC DEVICE      Component Value Range   Fecal Occult Bld NEGATIVE     Dg Chest 2 View  02/17/2012  *RADIOLOGY REPORT*  Clinical Data: Productive cough.  Evaluate for pneumonia.  CHEST - 2 VIEW  Comparison: None  Findings:  The heart size and mediastinal contours are within normal limits.  Both lungs are clear.  The visualized skeletal structures are unremarkable.  IMPRESSION: No active cardiopulmonary disease.  Original Report Authenticated By: Elsie Stain, M.D.    ED Course  Procedures (including critical care time)  Labs Reviewed -  No data to display No results found.   No diagnosis found.      MDM  Suggest laxatives milk of magnesia All symptoms are chronic Referral [facility]  Diagnosis constipation      Doug Sou, MD 03/07/12 [id number]  Doug Sou, MD 03/07/12 1191

## 2012-03-07 NOTE — ED Notes (Addendum)
Pt c/o lower abd pain, "moist" feeling hemorrhoid w/brown color discharge from rectum, foul breath smell, and increase body odor w/increase sweating. Pt also reports odor and sweating to rectum area after having a "wet dream."

## 2012-03-07 NOTE — ED Notes (Signed)
Pt c/o rectal pain and rash as well and brown malodorous discharge; pt sts bad breath and possible infection in mouth; pt sts possible hemorrhoids

## 2012-03-11 ENCOUNTER — Encounter (HOSPITAL_COMMUNITY): Payer: Self-pay | Admitting: Emergency Medicine

## 2012-03-11 ENCOUNTER — Emergency Department (HOSPITAL_COMMUNITY)
Admission: EM | Admit: 2012-03-11 | Discharge: 2012-03-11 | Disposition: A | Discharge status: Home / Self Care | Payer: BC Managed Care – PPO | Attending: Emergency Medicine | Admitting: Emergency Medicine

## 2012-03-11 DIAGNOSIS — I10 Essential (primary) hypertension: Secondary | ICD-10-CM | POA: Insufficient documentation

## 2012-03-11 DIAGNOSIS — K644 Residual hemorrhoidal skin tags: Secondary | ICD-10-CM | POA: Insufficient documentation

## 2012-03-11 DIAGNOSIS — K645 Perianal venous thrombosis: Secondary | ICD-10-CM | POA: Insufficient documentation

## 2012-03-11 MED ORDER — PROPOFOL 10 MG/ML IV BOLUS
0.5000 mg/kg | Freq: Once | INTRAVENOUS | Status: AC
  Administered 2012-03-11: 100 mg via INTRAVENOUS

## 2012-03-11 MED ORDER — SODIUM CHLORIDE 0.9 % IV BOLUS (SEPSIS)
500.0000 mL | Freq: Once | INTRAVENOUS | Status: AC
  Administered 2012-03-11: 500 mL via INTRAVENOUS

## 2012-03-11 MED ORDER — LIDOCAINE-EPINEPHRINE 1 %-1:100000 IJ SOLN
INTRAMUSCULAR | Status: AC
  Filled 2012-03-11: qty 1

## 2012-03-11 MED ORDER — PROPOFOL 10 MG/ML IV EMUL
INTRAVENOUS | Status: AC
  Administered 2012-03-11: 20 mg
  Filled 2012-03-11: qty 20

## 2012-03-11 NOTE — Discharge Instructions (Signed)
Hemorrhoids Hemorrhoids are veins in the rectum that get big. These veins can get blocked. Blocked veins become puffy (swollen) and painful. HOME CARE  Eat more fiber.   Drink enough fluid to keep your pee (urine) clear or pale yellow.   Exercise often.   Avoid straining to poop (bowel movement).   Keep the butt area dry and clean.   Only take medicine as told by your doctor.  If your hemorrhoids are puffy and painful:  Take a warm bath for 20 to 30 minutes. Do this 3 to 4 times a day.   Place ice packs on the area. Use the ice packs between the baths.   Put ice in a plastic bag.   Place a towel between your skin and the bag.   Leave the ice on for 15 to 20 minutes, 3 to 4 times a day.   Do not use a donut-shaped pillow. Do not sit on the toilet for a long time.   Go to the bathroom when your body has the urge to poop. This is so you do not strain as much to poop.  GET HELP RIGHT AWAY IF:   You have increasing pain that is not controlled with medicine.   You have uncontrolled bleeding.   You cannot poop.   You have pain or puffiness outside the area of the hemorrhoids.   You have chills.   You have a temperature by mouth above 102 F (38.9 C), not controlled by medicine.  MAKE SURE YOU:   Understand these instructions.   Will watch your condition.   Will get help right away if you are not doing well or get worse.  Document Released: 08/17/2008 Document Revised: 10/28/2011 Document Reviewed: 08/17/2008 [facility] Patient Information 2012 [place], [place].Fiber Content in Foods Drinking plenty of fluids and consuming foods high in fiber can help with constipation. See the list below for the fiber content of some common foods. Starches and Grains / Dietary Fiber (g)  Cheerios, 1 cup / 3 g   Kellogg's Corn Flakes, 1 cup / 0.7 g   Rice Krispies, 1  cup / 0.3 g   Quaker Oat Life Cereal,  cup / 2.1 g   Oatmeal, instant (cooked),  cup / 2 g   Kellogg's  Frosted Mini Wheats, 1 cup / 5.1 g   Rice, brown, long-grain (cooked), 1 cup / 3.5 g   Rice, white, long-grain (cooked), 1 cup / 0.6 g   Macaroni, cooked, enriched, 1 cup / 2.5 g  Legumes / Dietary Fiber (g)  Beans, baked, canned, plain or vegetarian,  cup / 5.2 g   Beans, kidney, canned,  cup / 6.8 g   Beans, pinto, dried (cooked),  cup / 7.7 g   Beans, pinto, canned,  cup / 5.5 g  Breads and Crackers / Dietary Fiber (g)  Graham crackers, plain or honey, 2 squares / 0.7 g   Saltine crackers, 3 squares / 0.3 g   Pretzels, plain, salted, 10 pieces / 1.8 g   Bread, whole-wheat, 1 slice / 1.9 g   Bread, white, 1 slice / 0.7 g   Bread, raisin, 1 slice / 1.2 g   Bagel, plain, 3 oz / 2 g   Tortilla, flour, 1 oz / 0.9 g   Tortilla, corn, 1 small / 1.5 g   Bun, hamburger or hotdog, 1 small / 0.9 g  Fruits / Dietary Fiber (g)  Apple, raw with skin, 1 medium / 4.4 g  Applesauce, sweetened,  cup / 1.5 g   Banana,  medium / 1.5 g   Grapes, 10 grapes / 0.4 g   Orange, 1 small / 2.3 g   Raisin, 1.5 oz / 1.6 g   Melon, 1 cup / 1.4 g  Vegetables / Dietary Fiber (g)  Green beans, canned,  cup / 1.3 g   Carrots (cooked),  cup / 2.3 g   Broccoli (cooked),  cup / 2.8 g   Peas, frozen (cooked),  cup / 4.4 g   Potatoes, mashed,  cup / 1.6 g   Lettuce, 1 cup / 0.5 g   Corn, canned,  cup / 1.6 g   Tomato,  cup / 1.1 g  Document Released: 03/27/2007 Document Revised: 10/28/2011 Document Reviewed: 05/22/2007 [facility] Patient Information 2012 [place], [place].  Continue anusol suppositories and sitz baths

## 2012-03-11 NOTE — ED Provider Notes (Addendum)
History     CSN: [id number]  Arrival date & time 03/11/12  1126   First MD Initiated Contact with Patient 03/11/12 1141      Chief Complaint  Patient presents with  . Hemorrhoids    (Consider location/radiation/quality/duration/timing/severity/associated sxs/prior treatment) Patient is a 41 y.o. male presenting with hematochezia. The history is provided by the patient.  Rectal Bleeding  The current episode started more than 2 weeks ago. The onset was gradual. The problem occurs continuously. The problem has been rapidly worsening. The pain is severe. The stool is described as bloody. There was no prior successful therapy. Prior unsuccessful therapies include diet changes, stool softeners, laxatives and enemas (Has been using Anusol suppositories without improvement). Associated symptoms include hemorrhoids and rectal pain. Pertinent negatives include no fever, no abdominal pain and no hematuria. Urine output has been normal. The last void occurred less than 6 hours ago. His past medical history is significant for recent abdominal injury. His past medical history does not include recent antibiotic use, recent change in diet or a recent illness. Recently, medical care has been given at this facility. Services received include medications given (Anusol suppositories).    Past Medical History  Diagnosis Date  . Arthritis   . Seasonal allergies   . Hypertension     History reviewed. No pertinent past surgical history.  No family history on file.  History  Substance Use Topics  . Smoking status: Never Smoker   . Smokeless tobacco: Not on file  . Alcohol Use: No      Review of Systems  Constitutional: Negative for fever.  Gastrointestinal: Positive for hematochezia, rectal pain and hemorrhoids. Negative for abdominal pain.  Genitourinary: Negative for hematuria.  All other systems reviewed and are negative.    Allergies  Review of patient's allergies indicates no known  allergies.  Home Medications   Current Outpatient Rx  Name Route Sig Dispense Refill  . ALBUTEROL SULFATE HFA 108 (90 BASE) MCG/ACT IN AERS Inhalation Inhale 2 puffs into the lungs every 4 (four) hours as needed. For wheezing    . PRAMOXINE HCL 1 % RE OINT Rectal Place 1 application rectally every 2 (two) hours as needed. For hemorrhoids    . DISPOSABLE ENEMA 19-7 GM/118ML RE ENEM Rectal Place 1 enema rectally once. follow package directions      BP 134/96  Pulse 76  Temp(Src) 98.8 F (37.1 C) (Oral)  Resp 18  SpO2 98%  Physical Exam  Nursing note and vitals reviewed. Constitutional: He is oriented to person, place, and time. He appears well-developed and well-nourished. He appears distressed.  HENT:  Head: Normocephalic and atraumatic.  Mouth/Throat: Oropharynx is clear and moist.  Eyes: EOM are normal. Pupils are equal, round, and reactive to light.  Cardiovascular: Normal rate, normal heart sounds and intact distal pulses.   Pulmonary/Chest: Effort normal and breath sounds normal. He has no wheezes. He has no rales.  Abdominal: Soft. Bowel sounds are normal. He exhibits no distension. There is no tenderness. There is no rebound and no guarding.  Genitourinary: Rectal exam shows external hemorrhoid and tenderness. Rectal exam shows no fissure.     Musculoskeletal: Normal range of motion. He exhibits no edema and no tenderness.  Neurological: He is alert and oriented to person, place, and time.  Skin: Skin is warm and dry. No rash noted. No erythema.  Psychiatric: He has a normal mood and affect. His behavior is normal.    ED Course  INCISION AND DRAINAGE Date/Time:  03/11/2012 1:15 PM Performed by: Gwyneth Sprout Authorized by: Gwyneth Sprout Consent: Verbal consent obtained. Written consent obtained. Risks and benefits: risks, benefits and alternatives were discussed Consent given by: patient Patient identity confirmed: verbally with patient Time out: Immediately  prior to procedure a "time out" was called to verify the correct patient, procedure, equipment, support staff and site/side marked as required. Indications for incision and drainage: Thrombosed hemorrhoid. Body area: anogenital (Rectum) Anesthesia: local infiltration Local anesthetic: lidocaine 1% without epinephrine Anesthetic total: 5 ml Patient sedated: yes Sedation type: moderate (conscious) sedation Sedatives: propofol Sedation start date/time: 03/11/2012 1:00 AM Sedation end date/time: 03/11/2012 1:16 PM Vitals: Vital signs were monitored during sedation. Scalpel size: 11 Incision type: elliptical Complexity: simple Drainage: bloody (Large clot evacuated) Wound treatment: wound left open Packing material: 1/2 in gauze Patient tolerance: Patient tolerated the procedure well with no immediate complications.   (including critical care time)  Labs Reviewed - No data to display No results found.   1. Thrombosed external hemorrhoid       MDM   Patient with thrombosed hemorrhoids on exam. He is in severe pain and did not feel he can tolerate an awake I&D.  Patient has been n.p.o. for 3-1/2 hours. At that time we can do conscious sedation and will be over 4 hours. He has no medical problems preventing a propofol sedation. Consciously sedate and I&D hemorrhoid.        Gwyneth Sprout, MD 03/11/12 1317  Gwyneth Sprout, MD 03/11/12 1332

## 2012-03-11 NOTE — ED Notes (Signed)
Pt. Stated, I've Hemroids  for a week and they keep getting worse, I can't even sit down.

## 2012-03-11 NOTE — ED Notes (Signed)
Pt c/o hemorrhoids onset x 2 wks, pt states, "I have been bleeding for 2 wks. It is bright red blood." pt denies dizziness, SOB, HA, & CP

## 2012-06-17 ENCOUNTER — Encounter (HOSPITAL_COMMUNITY): Payer: Self-pay | Admitting: *Deleted

## 2012-06-17 ENCOUNTER — Emergency Department (HOSPITAL_COMMUNITY)
Admission: EM | Admit: 2012-06-17 | Discharge: 2012-06-17 | Disposition: A | Discharge status: Home / Self Care | Payer: BC Managed Care – PPO | Attending: Emergency Medicine | Admitting: Emergency Medicine

## 2012-06-17 DIAGNOSIS — R21 Rash and other nonspecific skin eruption: Secondary | ICD-10-CM | POA: Insufficient documentation

## 2012-06-17 DIAGNOSIS — I10 Essential (primary) hypertension: Secondary | ICD-10-CM | POA: Insufficient documentation

## 2012-06-17 DIAGNOSIS — M129 Arthropathy, unspecified: Secondary | ICD-10-CM | POA: Insufficient documentation

## 2012-06-17 DIAGNOSIS — T169XXA Foreign body in ear, unspecified ear, initial encounter: Secondary | ICD-10-CM

## 2012-06-17 MED ORDER — CEPHALEXIN 500 MG PO CAPS: 500.0000 mg | ORAL_CAPSULE | Freq: Four times a day (QID) | ORAL | Status: AC

## 2012-06-17 MED ORDER — HYDROCORTISONE 1 % EX CREA: TOPICAL_CREAM | CUTANEOUS | Status: DC

## 2012-06-17 NOTE — ED Provider Notes (Signed)
History     CSN: [id number]  Arrival date & time 06/17/12  2207   First MD Initiated Contact with Patient 06/17/12 2220      Chief Complaint  Patient presents with  . Rash    (Consider location/radiation/quality/duration/timing/severity/associated sxs/prior treatment) HPI  Pt here for rash to chest, thighs, groin. He states that it started 5 days ago while having sex with his girlfriend, he informs me that he has a sweat gland problem that causes his ejaculations to go inside of his stomach. He was not able to give me any further information about that but he says, that its what typically causes this rash. He has had this rash ebfore. It does not itch, feel, warm, nor is painful. He says antibiotic and a cream usually make it go awake.He says that he smells something on the rash. Pt also complains about que tip  Stuck in left ear, he was cleaning out his ear and it got stuck in their. He deneis any dysuria or penile discharge. NAD/VSS.  Past Medical History  Diagnosis Date  . Arthritis   . Seasonal allergies   . Hypertension     History reviewed. No pertinent past surgical history.  History reviewed. No pertinent family history.  History  Substance Use Topics  . Smoking status: Never Smoker   . Smokeless tobacco: Not on file  . Alcohol Use: No      Review of Systems   HEENT: denies blurry vision or change in hearing PULMONARY: Denies difficulty breathing and SOB CARDIAC: denies chest pain or heart palpitations MUSCULOSKELETAL:  denies being unable to ambulate ABDOMEN AL: denies abdominal pain GU: denies loss of bowel or urinary control NEURO: denies numbness and tingling in extremities PSYCH: patient denies anxiety or depression. NECK: Pt denies having neck pain     Allergies  Review of patient's allergies indicates no known allergies.  Home Medications   Current Outpatient Rx  Name Route Sig Dispense Refill  . ASPIRIN 81 MG PO TABS Oral Take 81 mg by  mouth daily as needed. pain    . CEPHALEXIN 500 MG PO CAPS Oral Take 1 capsule (500 mg total) by mouth 4 (four) times daily. 28 capsule 0  . HYDROCORTISONE 1 % EX CREA  Apply to affected area 2 times daily 15 g 0    ADVISE PATIENT: DO NOT USE ON FACE    BP 145/95  Pulse 75  Temp 97.7 F (36.5 C) (Oral)  Resp 16  SpO2 100%  Physical Exam  Nursing note and vitals reviewed. Constitutional: He appears well-developed and well-nourished. No distress.  HENT:  Head: Normocephalic and atraumatic.  Eyes: Pupils are equal, round, and reactive to light.  Neck: Normal range of motion. Neck supple.  Cardiovascular: Normal rate and regular rhythm.   Pulmonary/Chest: Effort normal.  Abdominal: Soft.  Neurological: He is alert.  Skin: Skin is warm and dry. No bruising, no burn, no ecchymosis, no laceration, no petechiae and no purpura noted. Rash is not macular, not papular, not nodular, not pustular, not vesicular and not urticarial. No erythema.          Rash to areas as indicated. Rash is difficult to see due to patients very dark skin. But their is darkening of the skin to areas he indicates the rash covers. His skin is also dry. No abscessing or cellulitis.    ED Course  Procedures (including critical care time)  Labs Reviewed - No data to display No results found.  1. Rash   2. Foreign body in ear       MDM  Pt written for keflex and hydrocortisone cream. Pt advised not to use cream on face and only apply to affected areas.  Referral to dermatology given.  Pt has been advised of the symptoms that warrant their return to the ED. Patient has voiced understanding and has agreed to follow-up with the PCP or specialist.        Dorthula Matas, PA 06/17/12 [phone]

## 2012-06-17 NOTE — ED Notes (Signed)
Per PA: Pt. Advised not to use prescribed cream anywhere on the face. Pt. Informed to complete the full dose of antibiotic. Pt. Instructed to follow up with [facility].  Pt. Verbalized understanding. Respirations even and unlabored. Skin warm and dry. Denies pain. A.O. X 4. Ambulatory. NAD.

## 2012-06-17 NOTE — ED Notes (Addendum)
Tiffany, PA at bedside talking with patient about plan of care/disposition.  Patient has no other questions or concerns at this time; denies any needs.  Patient calm and cooperative.  Alert and oriented x4; PERRL present.  Will continue to monitor.

## 2012-06-17 NOTE — ED Notes (Addendum)
Pt states rash that is under his arms, in groin area, and testicular area. Pt states also in nasal area. Pt states unknown origin. Pt states received treatments for similar rash for years. Pt also wants ears checked. Pt states he thinks he has something in both his ears.

## 2012-06-18 NOTE — ED Provider Notes (Signed)
Medical screening examination/treatment/procedure(s) were performed by non-physician practitioner and as supervising physician I was immediately available for consultation/collaboration.  Tiann Saha M Saulo Anthis, MD 06/18/12 0802 

## 2012-07-05 ENCOUNTER — Other Ambulatory Visit (INDEPENDENT_AMBULATORY_CARE_PROVIDER_SITE_OTHER): Payer: BC Managed Care – PPO

## 2012-07-05 ENCOUNTER — Ambulatory Visit (INDEPENDENT_AMBULATORY_CARE_PROVIDER_SITE_OTHER): Payer: BC Managed Care – PPO | Admitting: Internal Medicine

## 2012-07-05 ENCOUNTER — Encounter: Payer: Self-pay | Admitting: Internal Medicine

## 2012-07-05 VITALS — BP 142/88 | HR 61 | Temp 98.4°F | Resp 17 | Ht 69.0 in | Wt 306.5 lb

## 2012-07-05 DIAGNOSIS — B356 Tinea cruris: Secondary | ICD-10-CM

## 2012-07-05 DIAGNOSIS — K921 Melena: Secondary | ICD-10-CM

## 2012-07-05 DIAGNOSIS — L74519 Primary focal hyperhidrosis, unspecified: Secondary | ICD-10-CM

## 2012-07-05 DIAGNOSIS — Z0000 Encounter for general adult medical examination without abnormal findings: Secondary | ICD-10-CM

## 2012-07-05 DIAGNOSIS — R61 Generalized hyperhidrosis: Secondary | ICD-10-CM

## 2012-07-05 DIAGNOSIS — Z23 Encounter for immunization: Secondary | ICD-10-CM | POA: Insufficient documentation

## 2012-07-05 LAB — CBC WITH DIFFERENTIAL/PLATELET
Basophils Absolute: 0 10*3/uL (ref 0.0–0.1)
Eosinophils Relative: 1 % (ref 0.0–5.0)
HCT: 41.1 % (ref 39.0–52.0)
Hemoglobin: 13.5 g/dL (ref 13.0–17.0)
Lymphs Abs: 1.9 10*3/uL (ref 0.7–4.0)
Monocytes Relative: 7.9 % (ref 3.0–12.0)
Neutro Abs: 6.5 10*3/uL (ref 1.4–7.7)
RDW: 12 % (ref 11.5–14.6)

## 2012-07-05 LAB — COMPREHENSIVE METABOLIC PANEL
ALT: 18 U/L (ref 0–53)
AST: 21 U/L (ref 0–37)
CO2: 27 mEq/L (ref 19–32)
Creatinine, Ser: 1 mg/dL (ref 0.4–1.5)
GFR: 106.99 mL/min (ref >60.00)
Total Bilirubin: 0.6 mg/dL (ref 0.3–1.2)

## 2012-07-05 LAB — LIPID PANEL
HDL: 42.2 mg/dL (ref >39.00)
LDL Cholesterol: 125 mg/dL — ABNORMAL HIGH (ref 0–99)
Total CHOL/HDL Ratio: 4
Triglycerides: 71 mg/dL (ref 0.0–149.0)
VLDL: 14.2 mg/dL (ref 0.0–40.0)

## 2012-07-05 MED ORDER — HYOSCYAMINE SULFATE ER 0.375 MG PO TB12: 0.3750 mg | ORAL_TABLET | Freq: Two times a day (BID) | ORAL | Status: DC | PRN

## 2012-07-05 MED ORDER — KETOCONAZOLE 2 % EX CREA: TOPICAL_CREAM | Freq: Two times a day (BID) | CUTANEOUS | Status: DC

## 2012-07-05 NOTE — Progress Notes (Signed)
Subjective:    Patient ID: Thomas Holloway, male    DOB: September 25, 1971, 41 y.o.   MRN: [id number]  Rash This is a recurrent problem. The current episode started more than 1 month ago. The problem is unchanged. The affected locations include the groin. The rash is characterized by dryness and itchiness. He was exposed to nothing. Pertinent negatives include no anorexia, congestion, cough, diarrhea, eye pain, facial edema, fatigue, fever, joint pain, nail changes, rhinorrhea, shortness of breath, sore throat or vomiting. (Excessive sweating) Past treatments include topical steroids. The treatment provided no relief.      Review of Systems  Constitutional: Positive for unexpected weight change (some weight gain). Negative for fever, chills, diaphoresis, activity change, appetite change and fatigue.  HENT: Negative.  Negative for congestion, sore throat and rhinorrhea.   Eyes: Negative.  Negative for pain.  Respiratory: Negative for cough, chest tightness, shortness of breath, wheezing and stridor.   Cardiovascular: Negative for chest pain, palpitations and leg swelling.  Gastrointestinal: Positive for blood in stool. Negative for nausea, vomiting, abdominal pain, diarrhea, constipation, abdominal distention, anal bleeding, rectal pain and anorexia.  Genitourinary: Negative for dysuria, urgency, frequency, hematuria, flank pain, decreased urine volume, discharge, penile swelling, scrotal swelling, enuresis, difficulty urinating, genital sores, penile pain and testicular pain.  Musculoskeletal: Negative for myalgias, back pain, joint pain, joint swelling, arthralgias and gait problem.  Skin: Positive for rash. Negative for nail changes, color change, pallor and wound.  Neurological: Negative.   Hematological: Negative for adenopathy. Does not bruise/bleed easily.  Psychiatric/Behavioral: Negative.        Objective:   Physical Exam  Vitals reviewed. Constitutional: He is oriented to person, place,  and time. He appears well-developed and well-nourished. No distress.  HENT:  Head: Normocephalic and atraumatic.  Mouth/Throat: Oropharynx is clear and moist. No oropharyngeal exudate.  Eyes: Conjunctivae are normal. Right eye exhibits no discharge. Left eye exhibits no discharge. No scleral icterus.  Neck: Normal range of motion. Neck supple. No JVD present. No tracheal deviation present. No thyromegaly present.  Cardiovascular: Normal rate, regular rhythm, normal heart sounds and intact distal pulses.  Exam reveals no gallop and no friction rub.   No murmur heard. Pulmonary/Chest: Effort normal and breath sounds normal. No stridor. No respiratory distress. He has no wheezes. He has no rales. He exhibits no tenderness.  Abdominal: Soft. Bowel sounds are normal. He exhibits no distension and no mass. There is no tenderness. There is no rebound and no guarding. Hernia confirmed negative in the right inguinal area and confirmed negative in the left inguinal area.  Genitourinary: Rectum normal, prostate normal, testes normal and penis normal. Rectal exam shows no external hemorrhoid, no internal hemorrhoid, no fissure, no mass and no tenderness. Guaiac negative stool. Prostate is not enlarged and not tender. Right testis shows no mass, no swelling and no tenderness. Right testis is descended. Left testis shows no mass, no swelling and no tenderness. Left testis is descended. Circumcised. No penile tenderness. No discharge found.  Musculoskeletal: Normal range of motion. He exhibits no edema and no tenderness.  Lymphadenopathy:    He has no cervical adenopathy.       Right: No inguinal adenopathy present.       Left: No inguinal adenopathy present.  Neurological: He is oriented to person, place, and time.  Skin: Skin is warm, dry and intact. Rash noted. No abrasion, no bruising, no burn, no ecchymosis, no laceration, no lesion, no petechiae and no purpura noted. Rash is not  macular, not papular, not  maculopapular, not nodular, not pustular, not vesicular and not urticarial. He is not diaphoretic. No erythema. No pallor.          In the groin bilaterally there is hyperpigmentation and scale  Psychiatric: He has a normal mood and affect. His behavior is normal. Judgment and thought content normal.          Assessment & Plan:

## 2012-07-05 NOTE — Assessment & Plan Note (Signed)
I will check his CBC and have asked him to see GI

## 2012-07-05 NOTE — Assessment & Plan Note (Signed)
I will check his labs to look for secondary causes and he will try levbid for symptom relief

## 2012-07-05 NOTE — Assessment & Plan Note (Signed)
Will treat with ketoconazole cream BID

## 2012-07-05 NOTE — Patient Instructions (Signed)
Health Maintenance, Males A healthy lifestyle and preventative care can promote health and wellness.  Maintain regular health, dental, and eye exams.   Eat a healthy diet. Foods like vegetables, fruits, whole grains, low-fat dairy products, and lean protein foods contain the nutrients you need without too many calories. Decrease your intake of foods high in solid fats, added sugars, and salt. Get information about a proper diet from your caregiver, if necessary.   Regular physical exercise is one of the most important things you can do for your health. Most adults should get at least 150 minutes of moderate-intensity exercise (any activity that increases your heart rate and causes you to sweat) each week. In addition, most adults need muscle-strengthening exercises on 2 or more days a week.    Maintain a healthy weight. The body mass index (BMI) is a screening tool to identify possible weight problems. It provides an estimate of body fat based on height and weight. Your caregiver can help determine your BMI, and can help you achieve or maintain a healthy weight. For adults 20 years and older:   A BMI below 18.5 is considered underweight.   A BMI of 18.5 to 24.9 is normal.   A BMI of 25 to 29.9 is considered overweight.   A BMI of 30 and above is considered obese.   Maintain normal blood lipids and cholesterol by exercising and minimizing your intake of saturated fat. Eat a balanced diet with plenty of fruits and vegetables. Blood tests for lipids and cholesterol should begin at age 20 and be repeated every 5 years. If your lipid or cholesterol levels are high, you are over 50, or you are a high risk for heart disease, you may need your cholesterol levels checked more frequently.Ongoing high lipid and cholesterol levels should be treated with medicines, if diet and exercise are not effective.   If you smoke, find out from your caregiver how to quit. If you do not use tobacco, do not start.    If you choose to drink alcohol, do not exceed 2 drinks per day. One drink is considered to be 12 ounces (355 mL) of beer, 5 ounces (148 mL) of wine, or 1.5 ounces (44 mL) of liquor.   Avoid use of street drugs. Do not share needles with anyone. Ask for help if you need support or instructions about stopping the use of drugs.   High blood pressure causes heart disease and increases the risk of stroke. Blood pressure should be checked at least every 1 to 2 years. Ongoing high blood pressure should be treated with medicines if weight loss and exercise are not effective.   If you are 45 to 41 years old, ask your caregiver if you should take aspirin to prevent heart disease.   Diabetes screening involves taking a blood sample to check your fasting blood sugar level. This should be done once every 3 years, after age 45, if you are within normal weight and without risk factors for diabetes. Testing should be considered at a younger age or be carried out more frequently if you are overweight and have at least 1 risk factor for diabetes.   Colorectal cancer can be detected and often prevented. Most routine colorectal cancer screening begins at the age of 50 and continues through age 75. However, your caregiver may recommend screening at an earlier age if you have risk factors for colon cancer. On a yearly basis, your caregiver may provide home test kits to check for hidden   blood in the stool. Use of a small camera at the end of a tube, to directly examine the colon (sigmoidoscopy or colonoscopy), can detect the earliest forms of colorectal cancer. Talk to your caregiver about this at age 50, when routine screening begins. Direct examination of the colon should be repeated every 5 to 10 years through age 75, unless early forms of pre-cancerous polyps or small growths are found.   Hepatitis C blood testing is recommended for all people born from 1945 through 1965 and any individual with known risks for  hepatitis C.   Healthy men should no longer receive prostate-specific antigen (PSA) blood tests as part of routine cancer screening. Consult with your caregiver about prostate cancer screening.   Testicular cancer screening is not recommended for adolescents or adult males who have no symptoms. Screening includes self-exam, caregiver exam, and other screening tests. Consult with your caregiver about any symptoms you have or any concerns you have about testicular cancer.   Practice safe sex. Use condoms and avoid high-risk sexual practices to reduce the spread of sexually transmitted infections (STIs).   Use sunscreen with a sun protection factor (SPF) of 30 or greater. Apply sunscreen liberally and repeatedly throughout the day. You should seek shade when your shadow is shorter than you. Protect yourself by wearing long sleeves, pants, a wide-brimmed hat, and sunglasses year round, whenever you are outdoors.   Notify your caregiver of new moles or changes in moles, especially if there is a change in shape or color. Also notify your caregiver if a mole is larger than the size of a pencil eraser.   A one-time screening for abdominal aortic aneurysm (AAA) and surgical repair of large AAAs by sound wave imaging (ultrasonography) is recommended for ages 65 to 75 years who are current or former smokers.   Stay current with your immunizations.  Document Released: 05/06/2008 Document Revised: 10/28/2011 Document Reviewed: 04/05/2011 [employer] Patient Information 2012 [employer]. 

## 2012-07-05 NOTE — Assessment & Plan Note (Signed)
Exam done, vaccines were updated, labs were ordered, pt ed material was given 

## 2012-08-17 ENCOUNTER — Emergency Department (HOSPITAL_COMMUNITY)
Admission: EM | Admit: 2012-08-17 | Discharge: 2012-08-17 | Disposition: A | Discharge status: Home / Self Care | Payer: BC Managed Care – PPO | Attending: Emergency Medicine | Admitting: Emergency Medicine

## 2012-08-17 DIAGNOSIS — J069 Acute upper respiratory infection, unspecified: Secondary | ICD-10-CM

## 2012-08-17 DIAGNOSIS — R3919 Other difficulties with micturition: Secondary | ICD-10-CM | POA: Insufficient documentation

## 2012-08-17 LAB — URINALYSIS, ROUTINE W REFLEX MICROSCOPIC
Bilirubin Urine: NEGATIVE
Glucose, UA: NEGATIVE mg/dL
Hgb urine dipstick: NEGATIVE
Ketones, ur: NEGATIVE mg/dL
pH: 6 (ref 5.0–8.0)

## 2012-08-17 LAB — GLUCOSE, CAPILLARY: Glucose-Capillary: 101 mg/dL — ABNORMAL HIGH (ref 70–99)

## 2012-08-17 MED ORDER — IBUPROFEN 600 MG PO TABS: 600.0000 mg | ORAL_TABLET | Freq: Four times a day (QID) | ORAL | Status: DC | PRN

## 2012-08-17 MED ORDER — BENZONATATE 100 MG PO CAPS: 100.0000 mg | ORAL_CAPSULE | Freq: Three times a day (TID) | ORAL | Status: DC

## 2012-08-17 MED ORDER — CETIRIZINE-PSEUDOEPHEDRINE ER 5-120 MG PO TB12: 1.0000 | ORAL_TABLET | Freq: Every day | ORAL | Status: DC

## 2012-08-17 NOTE — ED Provider Notes (Signed)
History     CSN: [id number]  Arrival date & time 08/17/12  6962   First MD Initiated Contact with Patient 08/17/12 [phone]      Chief Complaint  Patient presents with  . Nasal Congestion  . Sore Throat  . Cough    (Consider location/radiation/quality/duration/timing/severity/associated sxs/prior treatment) HPI Comments: Thomas Holloway is a 41 y.o. Male who presents with complaint of a nasal congestion, sore throat, sneezing, cough. States symptoms began about 2 days ago. Has been using cold medications from over the counter with no relief. States also has had a bad odor to his urine and pain with intercourse. States symptoms began 2 years ago. Saw his doctor and was told it could be "my thyroid." Pt denies fever, denies neck pain or stiffness. Denies nausea, vomiting, diarrhea, abdominal pain.     No past medical history on file.  No past surgical history on file.  Family History  Problem Relation Age of Onset  . Stroke Mother   . Hypertension Father   . Diabetes Father   . Breast cancer Sister   . Alcohol abuse Neg Hx   . Heart disease Neg Hx   . Hyperlipidemia Neg Hx   . Kidney disease Neg Hx     History  Substance Use Topics  . Smoking status: Never Smoker   . Smokeless tobacco: Never Used  . Alcohol Use: No      Review of Systems  Constitutional: Negative for fever and chills.  HENT: Positive for congestion, sore throat, rhinorrhea, sneezing and postnasal drip. Negative for neck pain.   Respiratory: Positive for cough. Negative for chest tightness and shortness of breath.   Cardiovascular: Negative.   Gastrointestinal: Negative for nausea, vomiting and abdominal pain.  Genitourinary: Negative for dysuria, urgency, frequency, flank pain, penile swelling, scrotal swelling and testicular pain.  Musculoskeletal: Negative.   Skin: Negative.   Neurological: Negative for dizziness, weakness and headaches.    Allergies  Review of patient's allergies indicates no  known allergies.  Home Medications  No current outpatient prescriptions on file.  BP 144/88  Pulse 85  Temp 98.5 F (36.9 C)  Resp 18  SpO2 100%  Physical Exam  Nursing note and vitals reviewed. Constitutional: He is oriented to person, place, and time. He appears well-developed and well-nourished. No distress.  HENT:  Head: Normocephalic and atraumatic.  Right Ear: Tympanic membrane, external ear and ear canal normal.  Left Ear: Tympanic membrane, external ear and ear canal normal.  Nose: Rhinorrhea present.  Mouth/Throat: Uvula is midline and oropharynx is clear and moist.       Post nasal drainage  Eyes: Conjunctivae normal are normal.  Neck: Normal range of motion. Neck supple.  Cardiovascular: Normal rate, regular rhythm and normal heart sounds.   Pulmonary/Chest: Effort normal and breath sounds normal. No respiratory distress. He has no wheezes. He has no rales.  Abdominal: Soft. Bowel sounds are normal. He exhibits no distension. There is no tenderness. There is no rebound.  Genitourinary: Penis normal. No penile tenderness.  Musculoskeletal: He exhibits no edema.  Lymphadenopathy:    He has no cervical adenopathy.  Neurological: He is alert and oriented to person, place, and time.  Skin: Skin is warm and dry.  Psychiatric: He has a normal mood and affect.    ED Course  Procedures (including critical care time)  Pt with URI symptoms. No evidence of strep. No meningismus. He is in no distress. Will check UA given malodorous.   Results for  orders placed during the hospital encounter of 08/17/12  URINALYSIS, ROUTINE W REFLEX MICROSCOPIC      Component Value Range   Color, Urine YELLOW  YELLOW   APPearance CLEAR  CLEAR   Specific Gravity, Urine 1.024  1.005 - 1.030   pH 6.0  5.0 - 8.0   Glucose, UA NEGATIVE  NEGATIVE mg/dL   Hgb urine dipstick NEGATIVE  NEGATIVE   Bilirubin Urine NEGATIVE  NEGATIVE   Ketones, ur NEGATIVE  NEGATIVE mg/dL   Protein, ur NEGATIVE   NEGATIVE mg/dL   Urobilinogen, UA 1.0  0.0 - 1.0 mg/dL   Nitrite NEGATIVE  NEGATIVE   Leukocytes, UA NEGATIVE  NEGATIVE  GLUCOSE, CAPILLARY      Component Value Range   Glucose-Capillary 101 (*) 70 - 99 mg/dL   No results found.  Filed Vitals:   08/17/12 0943  BP: 144/88  Pulse: 85  Temp: 98.5 F (36.9 C)  Resp: 18   Pt with URI symptoms. UA negative. Will treat symptomatically. He is afebrile, lungs are clear. PT to follow up with pcp.  1. URI (upper respiratory infection)       MDM          Lottie Mussel, PA 08/17/12 1519

## 2012-08-17 NOTE — ED Notes (Signed)
CBG 101 

## 2012-08-17 NOTE — ED Notes (Signed)
Pt c/o congestion, cough, and sore throat x 2 days, denies nausea/vomiting/diarrhea/fevers, also c/o foul smelling urine x 1 year, has seen dr and told maybe had thyroid problem, denies productive cough

## 2012-08-18 NOTE — ED Provider Notes (Signed)
Medical screening examination/treatment/procedure(s) were performed by non-physician practitioner and as supervising physician I was immediately available for consultation/collaboration.  Janzen Sacks, MD 08/18/12 0706 

## 2012-10-09 ENCOUNTER — Encounter (HOSPITAL_COMMUNITY): Payer: Self-pay | Admitting: *Deleted

## 2012-10-09 ENCOUNTER — Emergency Department (HOSPITAL_COMMUNITY)
Admission: EM | Admit: 2012-10-09 | Discharge: 2012-10-09 | Disposition: A | Discharge status: Home Health | Payer: BC Managed Care – PPO | Attending: Emergency Medicine | Admitting: Emergency Medicine

## 2012-10-09 DIAGNOSIS — Z87898 Personal history of other specified conditions: Secondary | ICD-10-CM | POA: Insufficient documentation

## 2012-10-09 DIAGNOSIS — L0231 Cutaneous abscess of buttock: Secondary | ICD-10-CM | POA: Insufficient documentation

## 2012-10-09 DIAGNOSIS — L03317 Cellulitis of buttock: Secondary | ICD-10-CM | POA: Insufficient documentation

## 2012-10-09 HISTORY — DX: Unspecified hemorrhoids: K64.9

## 2012-10-09 LAB — URINALYSIS, ROUTINE W REFLEX MICROSCOPIC
Leukocytes, UA: NEGATIVE
Nitrite: NEGATIVE
Specific Gravity, Urine: 1.034 — ABNORMAL HIGH (ref 1.005–1.030)
pH: 5.5 (ref 5.0–8.0)

## 2012-10-09 MED ORDER — CEPHALEXIN 500 MG PO CAPS: 500.0000 mg | ORAL_CAPSULE | Freq: Four times a day (QID) | ORAL | Status: DC

## 2012-10-09 MED ORDER — SULFAMETHOXAZOLE-TRIMETHOPRIM 800-160 MG PO TABS: 1.0000 | ORAL_TABLET | Freq: Two times a day (BID) | ORAL | Status: DC

## 2012-10-09 MED ORDER — DOCUSATE SODIUM 100 MG PO CAPS: 100.0000 mg | ORAL_CAPSULE | Freq: Two times a day (BID) | ORAL | Status: DC

## 2012-10-09 NOTE — ED Notes (Signed)
PT is here with smelly urine and rash to butt.  PT states every BM he gets rash

## 2012-10-09 NOTE — ED Notes (Signed)
Stuff leaking out of his rectum

## 2012-10-09 NOTE — ED Provider Notes (Signed)
History     CSN: [id number]  Arrival date & time 10/09/12  1191   First MD Initiated Contact with Patient 10/09/12 1156      Chief Complaint  Patient presents with  . Rash    (Consider location/radiation/quality/duration/timing/severity/associated sxs/prior treatment) HPI Comments: Pt comes in with cc of rash. Pt states that over the past 1 day, he has noticed that every time he wipes, there is some fluid, foul smelling, and he is unsure what color it is. The area around his anus - on the right side of his buttock, he feels that also is slightly swollen with some pain in that area. He has has hx of boils in the past. No n/v/f/c. No hx of hemorrhoids.  Patient is a 41 y.o. male presenting with rash. The history is provided by the patient.  Rash     Past Medical History  Diagnosis Date  . Hemorrhoid     History reviewed. No pertinent past surgical history.  Family History  Problem Relation Age of Onset  . Stroke Mother   . Hypertension Father   . Diabetes Father   . Breast cancer Sister   . Alcohol abuse Neg Hx   . Heart disease Neg Hx   . Hyperlipidemia Neg Hx   . Kidney disease Neg Hx     History  Substance Use Topics  . Smoking status: Never Smoker   . Smokeless tobacco: Never Used  . Alcohol Use: No      Review of Systems  Constitutional: Negative for activity change and appetite change.  Respiratory: Negative for cough and shortness of breath.   Cardiovascular: Negative for chest pain.  Gastrointestinal: Negative for abdominal pain.  Genitourinary: Negative for dysuria.  Skin: Positive for rash.    Allergies  Review of patient's allergies indicates no known allergies.  Home Medications  No current outpatient prescriptions on file.  BP 143/89  Pulse 73  Temp 98.1 F (36.7 C) (Oral)  Resp 16  SpO2 96%  Physical Exam  Nursing note and vitals reviewed. Constitutional: He is oriented to person, place, and time. He appears well-developed.    HENT:  Head: Normocephalic and atraumatic.  Eyes: Conjunctivae normal and EOM are normal. Pupils are equal, round, and reactive to light.  Neck: Normal range of motion. Neck supple.  Cardiovascular: Normal rate and regular rhythm.   Pulmonary/Chest: Effort normal and breath sounds normal.  Abdominal: Soft. Bowel sounds are normal. He exhibits no distension. There is no tenderness. There is no rebound and no guarding.       Pt was placed in prone position. Right buttock - around the anus there is mild swelling noted, but no true fluctuance, no hemorrhoids, no lesions consistent with hidradenitis. There is one area consistent with anal fissure - but that is not located around the site mentioned above. No drainage appreciated with deep pressure, + tenderness.  Neurological: He is alert and oriented to person, place, and time.  Skin: Skin is warm.    ED Course  Procedures (including critical care time)   Labs Reviewed  URINALYSIS, ROUTINE W REFLEX MICROSCOPIC   No results found.   No diagnosis found.    MDM  Pt comes in with cc of rash. Has possible perianal disease, cellulitis, vs. Phlegmon. No appreciable fluctuance, and with the location of the lesion, not a good candidate to attempt i&d without hard evidence of underlying abscess. No concerns for necrotizing fascitis, patient is healthy - and has no systemic complains.  Plan is to start antibiotics. Pt advised to get daily SITz bath, keeping the area clean and dry, and if no improvement, surgery # is provided, as patient has no PCP.        Derwood Kaplan, MD 10/09/12 1312

## 2013-02-10 ENCOUNTER — Emergency Department (HOSPITAL_COMMUNITY)
Admission: EM | Admit: 2013-02-10 | Discharge: 2013-02-10 | Disposition: A | Discharge status: Home / Self Care | Payer: Self-pay | Attending: Emergency Medicine | Admitting: Emergency Medicine

## 2013-02-10 ENCOUNTER — Encounter (HOSPITAL_COMMUNITY): Payer: Self-pay | Admitting: *Deleted

## 2013-02-10 DIAGNOSIS — J45909 Unspecified asthma, uncomplicated: Secondary | ICD-10-CM | POA: Insufficient documentation

## 2013-02-10 DIAGNOSIS — M62830 Muscle spasm of back: Secondary | ICD-10-CM

## 2013-02-10 DIAGNOSIS — Z8719 Personal history of other diseases of the digestive system: Secondary | ICD-10-CM | POA: Insufficient documentation

## 2013-02-10 DIAGNOSIS — I10 Essential (primary) hypertension: Secondary | ICD-10-CM | POA: Insufficient documentation

## 2013-02-10 DIAGNOSIS — Z8679 Personal history of other diseases of the circulatory system: Secondary | ICD-10-CM | POA: Insufficient documentation

## 2013-02-10 DIAGNOSIS — R269 Unspecified abnormalities of gait and mobility: Secondary | ICD-10-CM | POA: Insufficient documentation

## 2013-02-10 DIAGNOSIS — M545 Low back pain: Secondary | ICD-10-CM

## 2013-02-10 DIAGNOSIS — Z79899 Other long term (current) drug therapy: Secondary | ICD-10-CM | POA: Insufficient documentation

## 2013-02-10 DIAGNOSIS — M5380 Other specified dorsopathies, site unspecified: Secondary | ICD-10-CM | POA: Insufficient documentation

## 2013-02-10 DIAGNOSIS — Z8709 Personal history of other diseases of the respiratory system: Secondary | ICD-10-CM | POA: Insufficient documentation

## 2013-02-10 HISTORY — DX: Constipation, unspecified: K59.00

## 2013-02-10 HISTORY — DX: Bronchitis, not specified as acute or chronic: J40

## 2013-02-10 HISTORY — DX: Unspecified asthma, uncomplicated: J45.909

## 2013-02-10 MED ORDER — KETOROLAC TROMETHAMINE 60 MG/2ML IM SOLN
60.0000 mg | Freq: Once | INTRAMUSCULAR | Status: AC
  Administered 2013-02-10: 60 mg via INTRAMUSCULAR
  Filled 2013-02-10: qty 2

## 2013-02-10 MED ORDER — NAPROXEN 500 MG PO TABS: 500.0000 mg | ORAL_TABLET | Freq: Two times a day (BID) | ORAL | Status: DC | PRN

## 2013-02-10 MED ORDER — HYDROCODONE-ACETAMINOPHEN 5-325 MG PO TABS: 1.0000 | ORAL_TABLET | Freq: Four times a day (QID) | ORAL | Status: DC | PRN

## 2013-02-10 MED ORDER — METHOCARBAMOL 750 MG PO TABS: 750.0000 mg | ORAL_TABLET | Freq: Four times a day (QID) | ORAL | Status: DC | PRN

## 2013-02-10 NOTE — ED Provider Notes (Signed)
History     CSN: [id number]  Arrival date & time 02/10/13  1113   First MD Initiated Contact with Patient 02/10/13 1146      Chief Complaint  Patient presents with  . Back Pain    (Consider location/radiation/quality/duration/timing/severity/associated sxs/prior treatment) The history is provided by the patient and medical records. No language interpreter was used.    Thomas Holloway is a 42 y.o. male  with a hx of hypertension and asthma presents to the Emergency Department complaining of gradual, persistent, progressively worsening lower back pain after attempting to get out of bed this morning onset approximately 3 hours ago.  She denies fall, injury, trauma of any sort. Patient denies history of back problems and back surgeries. Patient denies symptoms of cauda equina including loss of bowel or bladder continence, loss of sensation, saddle anesthesia or loss of function in his legs. Patient states he is able to walk but it is painful. Associated symptoms include back pain.  Nothing makes it better and palpation, movement, walking makes it worse.  Pt denies fever, chills, headache, neck pain chest pain, shortness of breath, abdominal pain, nausea, vomiting, diarrhea, weakness, dizziness, numbness, difficulty walking, syncope, dysuria. Patient denies IV drug use or history of cancer.  Patient states the pain is sharp and oozing and, rated at a 10 out of 10, constant and nonradiating.   Past Medical History  Diagnosis Date  . Hemorrhoid   . Hypertension   . Constipation   . Asthma   . Bronchitis     History reviewed. No pertinent past surgical history.  Family History  Problem Relation Age of Onset  . Stroke Mother   . Hypertension Father   . Diabetes Father   . Breast cancer Sister   . Alcohol abuse Neg Hx   . Heart disease Neg Hx   . Hyperlipidemia Neg Hx   . Kidney disease Neg Hx     History  Substance Use Topics  . Smoking status: Never Smoker   . Smokeless tobacco:  Never Used  . Alcohol Use: No      Review of Systems  Constitutional: Negative for fever and fatigue.  HENT: Negative for neck pain and neck stiffness.   Respiratory: Negative for chest tightness and shortness of breath.   Cardiovascular: Negative for chest pain.  Gastrointestinal: Negative for nausea, vomiting, abdominal pain and diarrhea.  Genitourinary: Negative for dysuria, urgency, frequency and hematuria.  Musculoskeletal: Positive for back pain and gait problem ( 2/2 pain). Negative for joint swelling.  Skin: Negative for rash.  Neurological: Negative for weakness, light-headedness, numbness and headaches.  All other systems reviewed and are negative.    Allergies  Review of patient's allergies indicates no known allergies.  Home Medications   Current Outpatient Rx  Name  Route  Sig  Dispense  Refill  . albuterol (PROVENTIL HFA;VENTOLIN HFA) 108 (90 BASE) MCG/ACT inhaler   Inhalation   Inhale 2 puffs into the lungs every 6 (six) hours as needed for wheezing.         . cetirizine (ZYRTEC) 10 MG tablet   Oral   Take 10 mg by mouth daily as needed for allergies.         Marland Kitchen losartan (COZAAR) 50 MG tablet   Oral   Take 50 mg by mouth daily.         Marland Kitchen HYDROcodone-acetaminophen (NORCO/VICODIN) 5-325 MG per tablet   Oral   Take 1 tablet by mouth every 6 (six) hours as needed  for pain (Take 1 - 2 tablets every 4 - 6 hours.).   10 tablet   0   . methocarbamol (ROBAXIN) 750 MG tablet   Oral   Take 1 tablet (750 mg total) by mouth 4 (four) times daily as needed (Take 1 tablet every 6 hours as needed for muscle spasms.).   20 tablet   0   . naproxen (NAPROSYN) 500 MG tablet   Oral   Take 1 tablet (500 mg total) by mouth 2 (two) times daily as needed.   30 tablet   0     BP 139/85  Pulse 92  Temp(Src) 97.8 F (36.6 C) (Oral)  Resp 16  SpO2 95%  Physical Exam  Nursing note and vitals reviewed. Constitutional: He is oriented to person, place, and time.  He appears well-developed and well-nourished. No distress.  HENT:  Head: Normocephalic and atraumatic.  Mouth/Throat: Oropharynx is clear and moist. No oropharyngeal exudate.  Eyes: Conjunctivae are normal. Pupils are equal, round, and reactive to light.  Neck: Normal range of motion and full passive range of motion without pain. Neck supple.  Full ROM without pain  Cardiovascular: Normal rate, regular rhythm, normal heart sounds and intact distal pulses.   Pulses:      Radial pulses are 2+ on the right side, and 2+ on the left side.       Dorsalis pedis pulses are 2+ on the right side, and 2+ on the left side.       Posterior tibial pulses are 2+ on the right side, and 2+ on the left side.  Pulmonary/Chest: Effort normal and breath sounds normal. No respiratory distress. He has no wheezes.  Abdominal: Soft. He exhibits no distension. There is no tenderness.  Musculoskeletal:  Decreased range of motion of the L-spine secondary to pain No tenderness to palpation of the spinous processes of the T-spine or L-spine Mild tenderness to palpation of the paraspinous muscles of the L-spine  Lymphadenopathy:    He has no cervical adenopathy.  Neurological: He is alert and oriented to person, place, and time. He has normal reflexes. GCS eye subscore is 4. GCS verbal subscore is 5. GCS motor subscore is 6.  Reflex Scores:      Tricep reflexes are 2+ on the right side and 2+ on the left side.      Bicep reflexes are 2+ on the right side and 2+ on the left side.      Brachioradialis reflexes are 2+ on the right side and 2+ on the left side.      Patellar reflexes are 2+ on the right side and 2+ on the left side.      Achilles reflexes are 2+ on the right side and 2+ on the left side. Speech is clear and goal oriented, follows commands Normal strength in upper and lower extremities bilaterally including dorsiflexion and plantar flexion, strong and equal grip strength Sensation normal to light and sharp  touch Moves extremities without ataxia, coordination intact Normal gait Normal balance   Skin: Skin is warm and dry. No rash noted. He is not diaphoretic. No erythema.    ED Course  Procedures (including critical care time)  Labs Reviewed - No data to display No results found.   1. Back muscle spasm   2. Low back pain       MDM  Thomas Holloway presents with nontraumatic back pain.  No neurological deficits and normal neuro exam.  Patient can walk but states  is painful.  No loss of bowel or bladder control.  No concern for cauda equina.  No fever, night sweats, weight loss, h/o cancer, IVDU.  RICE protocol and pain medicine indicated and discussed with patient.   1. Medications: robaxin, naproxyn, vicodin, usual home medications 2. Treatment: rest, drink plenty of fluids, gentle stretching as discussed, alternate ice and heat 3. Follow Up: Please followup with your primary doctor for discussion of your diagnoses and further evaluation after today's visit; if you do not have a primary care doctor use the resource guide provided to find one;          Dierdre Forth, PA-C 02/10/13 1314

## 2013-02-10 NOTE — ED Notes (Signed)
Pt twisted to get out of bed and hurt his back.  Denies hx of chronic back problems.  Pt c/o c/o lumbar pain that increases with any movement.

## 2013-02-11 NOTE — ED Provider Notes (Signed)
Medical screening examination/treatment/procedure(s) were performed by non-physician practitioner and as supervising physician I was immediately available for consultation/collaboration.    Collie Wernick D Cordell Coke, MD 02/11/13 0751 

## 2013-03-24 ENCOUNTER — Emergency Department (HOSPITAL_COMMUNITY)
Admission: EM | Admit: 2013-03-24 | Discharge: 2013-03-24 | Disposition: A | Discharge status: Home / Self Care | Payer: Self-pay | Attending: Emergency Medicine | Admitting: Emergency Medicine

## 2013-03-24 ENCOUNTER — Encounter (HOSPITAL_COMMUNITY): Payer: Self-pay | Admitting: Emergency Medicine

## 2013-03-24 DIAGNOSIS — R369 Urethral discharge, unspecified: Secondary | ICD-10-CM | POA: Insufficient documentation

## 2013-03-24 DIAGNOSIS — I10 Essential (primary) hypertension: Secondary | ICD-10-CM | POA: Insufficient documentation

## 2013-03-24 DIAGNOSIS — Z79899 Other long term (current) drug therapy: Secondary | ICD-10-CM | POA: Insufficient documentation

## 2013-03-24 DIAGNOSIS — J45909 Unspecified asthma, uncomplicated: Secondary | ICD-10-CM | POA: Insufficient documentation

## 2013-03-24 DIAGNOSIS — N342 Other urethritis: Secondary | ICD-10-CM | POA: Insufficient documentation

## 2013-03-24 DIAGNOSIS — Z8719 Personal history of other diseases of the digestive system: Secondary | ICD-10-CM | POA: Insufficient documentation

## 2013-03-24 DIAGNOSIS — Z8679 Personal history of other diseases of the circulatory system: Secondary | ICD-10-CM | POA: Insufficient documentation

## 2013-03-24 MED ORDER — LIDOCAINE HCL (PF) 1 % IJ SOLN
INTRAMUSCULAR | Status: AC
  Administered 2013-03-24: 0.9 mL
  Filled 2013-03-24: qty 5

## 2013-03-24 MED ORDER — CEFTRIAXONE SODIUM 250 MG IJ SOLR
250.0000 mg | Freq: Once | INTRAMUSCULAR | Status: AC
  Administered 2013-03-24: 250 mg via INTRAMUSCULAR
  Filled 2013-03-24: qty 250

## 2013-03-24 MED ORDER — AZITHROMYCIN 250 MG PO TABS
1000.0000 mg | ORAL_TABLET | Freq: Once | ORAL | Status: AC
  Administered 2013-03-24: 1000 mg via ORAL
  Filled 2013-03-24: qty 4

## 2013-03-24 NOTE — ED Notes (Signed)
Pt presents to ED today with abdominal pain, burning with urination, dry mouth for about 5-6 days. NAD.

## 2013-03-24 NOTE — ED Notes (Signed)
CBG of 91 completed

## 2013-03-24 NOTE — ED Provider Notes (Signed)
History     CSN: [id number]  Arrival date & time 03/24/13  1638   First MD Initiated Contact with Patient 03/24/13 1739      Chief Complaint  Patient presents with  . Abdominal Pain    The history is provided by the patient.   patient reports 6-7 days of new penile discharge.  He did have intercourse with a different male than he normally does.  He denies testicular pain.  No fevers or chills.  He reports some generalized abdominal discomfort without nausea vomiting or diarrhea.  He also reports his had a dry mouth for several days.  No polyuria or polydipsia.  He has no primary care physician at this time.  He has no other complaints.  His symptoms are mild in severity.  Nothing worsens or improves his symptoms.  No flank pain.  Past Medical History  Diagnosis Date  . Hemorrhoid   . Hypertension   . Constipation   . Asthma   . Bronchitis     History reviewed. No pertinent past surgical history.  Family History  Problem Relation Age of Onset  . Stroke Mother   . Hypertension Father   . Diabetes Father   . Breast cancer Sister   . Alcohol abuse Neg Hx   . Heart disease Neg Hx   . Hyperlipidemia Neg Hx   . Kidney disease Neg Hx     History  Substance Use Topics  . Smoking status: Never Smoker   . Smokeless tobacco: Never Used  . Alcohol Use: No      Review of Systems  Gastrointestinal: Positive for abdominal pain.  All other systems reviewed and are negative.    Allergies  Review of patient's allergies indicates no known allergies.  Home Medications   Current Outpatient Rx  Name  Route  Sig  Dispense  Refill  . albuterol (PROVENTIL HFA;VENTOLIN HFA) 108 (90 BASE) MCG/ACT inhaler   Inhalation   Inhale 2 puffs into the lungs every 6 (six) hours as needed for wheezing. For wheezing or shortness of breath         . cetirizine (ZYRTEC) 10 MG tablet   Oral   Take 10 mg by mouth daily as needed for allergies. For allergies         . losartan (COZAAR)  50 MG tablet   Oral   Take 50 mg by mouth daily.           BP 140/99  Pulse 86  Temp(Src) 98 F (36.7 C) (Oral)  Ht 5\' 9"  (1.753 m)  Wt 280 lb (127.007 kg)  BMI 41.33 kg/m2  SpO2 95%  Physical Exam  Nursing note and vitals reviewed. Constitutional: He is oriented to person, place, and time. He appears well-developed and well-nourished.  HENT:  Head: Normocephalic and atraumatic.  Eyes: EOM are normal.  Neck: Normal range of motion.  Cardiovascular: Normal rate, regular rhythm, normal heart sounds and intact distal pulses.   Pulmonary/Chest: Effort normal and breath sounds normal. No respiratory distress.  Abdominal: Soft. He exhibits no distension. There is no tenderness.  Genitourinary: Rectum normal.  Obvious penile discharge, no testicular tenderness.  Circumcised penis.  No tenderness of the shaft of his penis  Musculoskeletal: Normal range of motion.  Neurological: He is alert and oriented to person, place, and time.  Skin: Skin is warm and dry.  Psychiatric: He has a normal mood and affect. Judgment normal.    ED Course  Procedures (including critical care time)  Labs Reviewed  GC/CHLAMYDIA PROBE AMP  GLUCOSE, CAPILLARY   No results found.   1. Urethritis       MDM  We'll treat the patient for suspected urethritis given his urethral discharge.  Discharge home with instructions to have all sexual partners seen and evaluated at the health department for possibly sexually transmitted infection        Lyanne Co, MD 03/24/13 1840

## 2013-03-27 LAB — GC/CHLAMYDIA PROBE AMP: GC Probe RNA: NEGATIVE

## 2013-03-28 NOTE — ED Notes (Signed)
+   Chlamydia Patient treated with Rocephin And Zithromax-[facility] faxed 

## 2013-03-29 ENCOUNTER — Telehealth (HOSPITAL_COMMUNITY): Payer: Self-pay | Admitting: Emergency Medicine

## 2013-03-30 ENCOUNTER — Telehealth (HOSPITAL_COMMUNITY): Payer: Self-pay | Admitting: Emergency Medicine

## 2013-03-31 ENCOUNTER — Emergency Department (HOSPITAL_COMMUNITY): Payer: Self-pay

## 2013-03-31 ENCOUNTER — Telehealth (HOSPITAL_COMMUNITY): Payer: Self-pay | Admitting: Emergency Medicine

## 2013-03-31 ENCOUNTER — Emergency Department (HOSPITAL_COMMUNITY)
Admission: EM | Admit: 2013-03-31 | Discharge: 2013-03-31 | Disposition: A | Discharge status: Home / Self Care | Payer: Self-pay | Attending: Emergency Medicine | Admitting: Emergency Medicine

## 2013-03-31 ENCOUNTER — Encounter (HOSPITAL_COMMUNITY): Payer: Self-pay | Admitting: Adult Health

## 2013-03-31 DIAGNOSIS — Z8679 Personal history of other diseases of the circulatory system: Secondary | ICD-10-CM | POA: Insufficient documentation

## 2013-03-31 DIAGNOSIS — R1084 Generalized abdominal pain: Secondary | ICD-10-CM | POA: Insufficient documentation

## 2013-03-31 DIAGNOSIS — Z8719 Personal history of other diseases of the digestive system: Secondary | ICD-10-CM | POA: Insufficient documentation

## 2013-03-31 DIAGNOSIS — R109 Unspecified abdominal pain: Secondary | ICD-10-CM

## 2013-03-31 DIAGNOSIS — Z79899 Other long term (current) drug therapy: Secondary | ICD-10-CM | POA: Insufficient documentation

## 2013-03-31 DIAGNOSIS — J45909 Unspecified asthma, uncomplicated: Secondary | ICD-10-CM | POA: Insufficient documentation

## 2013-03-31 DIAGNOSIS — R112 Nausea with vomiting, unspecified: Secondary | ICD-10-CM | POA: Insufficient documentation

## 2013-03-31 DIAGNOSIS — I10 Essential (primary) hypertension: Secondary | ICD-10-CM | POA: Insufficient documentation

## 2013-03-31 DIAGNOSIS — Z8709 Personal history of other diseases of the respiratory system: Secondary | ICD-10-CM | POA: Insufficient documentation

## 2013-03-31 HISTORY — DX: Gastro-esophageal reflux disease without esophagitis: K21.9

## 2013-03-31 LAB — CBC WITH DIFFERENTIAL/PLATELET
Lymphocytes Relative: 7 % — ABNORMAL LOW (ref 12–46)
Lymphs Abs: 0.7 10*3/uL (ref 0.7–4.0)
Neutro Abs: 8.3 10*3/uL — ABNORMAL HIGH (ref 1.7–7.7)
Neutrophils Relative %: 86 % — ABNORMAL HIGH (ref 43–77)
Platelets: 309 10*3/uL (ref 150–400)
RBC: 4.95 MIL/uL (ref 4.22–5.81)
WBC: 9.6 10*3/uL (ref 4.0–10.5)

## 2013-03-31 LAB — COMPREHENSIVE METABOLIC PANEL
BUN: 15 mg/dL (ref 6–23)
CO2: 24 mEq/L (ref 19–32)
Calcium: 8.9 mg/dL (ref 8.4–10.5)
Chloride: 108 mEq/L (ref 96–112)
Creatinine, Ser: 1.28 mg/dL (ref 0.50–1.35)
GFR calc non Af Amer: 68 mL/min — ABNORMAL LOW (ref >90)
Total Bilirubin: 0.9 mg/dL (ref 0.3–1.2)

## 2013-03-31 LAB — LIPASE, BLOOD: Lipase: 23 U/L (ref 11–59)

## 2013-03-31 LAB — ETHANOL: Alcohol, Ethyl (B): <11 mg/dL (ref 0–11)

## 2013-03-31 MED ORDER — TRAMADOL HCL 50 MG PO TABS: 50.0000 mg | ORAL_TABLET | Freq: Four times a day (QID) | ORAL | Status: DC | PRN

## 2013-03-31 MED ORDER — IOHEXOL 300 MG/ML  SOLN
100.0000 mL | Freq: Once | INTRAMUSCULAR | Status: AC | PRN
  Administered 2013-03-31: 100 mL via INTRAVENOUS

## 2013-03-31 MED ORDER — SODIUM CHLORIDE 0.9 % IV SOLN
INTRAVENOUS | Status: DC
  Administered 2013-03-31: 05:00:00 via INTRAVENOUS

## 2013-03-31 MED ORDER — MORPHINE SULFATE 4 MG/ML IJ SOLN
4.0000 mg | INTRAMUSCULAR | Status: DC | PRN
  Administered 2013-03-31: 4 mg via INTRAVENOUS
  Filled 2013-03-31: qty 1

## 2013-03-31 MED ORDER — ONDANSETRON HCL 4 MG PO TABS: 4.0000 mg | ORAL_TABLET | Freq: Three times a day (TID) | ORAL | Status: DC | PRN

## 2013-03-31 MED ORDER — IOHEXOL 300 MG/ML  SOLN
20.0000 mL | INTRAMUSCULAR | Status: AC
  Administered 2013-03-31: 50 mL via ORAL

## 2013-03-31 MED ORDER — ONDANSETRON HCL 4 MG/2ML IJ SOLN
4.0000 mg | INTRAMUSCULAR | Status: DC | PRN
  Administered 2013-03-31: 4 mg via INTRAVENOUS
  Filled 2013-03-31: qty 2

## 2013-03-31 NOTE — ED Notes (Signed)
Unable to contact patient via phone. Sent letter. °

## 2013-03-31 NOTE — ED Notes (Signed)
Presents with lower abdominal pain, nausea, vomiting and diarrhea that began 2 days ago. Denies blood in stool or emesis.

## 2013-03-31 NOTE — ED Notes (Signed)
Pt reports NVD x4-5 days, states drank 2 beers tonight and began vomiting uncontrollably PTA.

## 2013-03-31 NOTE — ED Notes (Signed)
PT transported to CT>

## 2013-03-31 NOTE — ED Provider Notes (Signed)
History     CSN: [id number]  Arrival date & time 03/31/13  0228   First MD Initiated Contact with Patient 03/31/13 [phone]      Chief Complaint  Patient presents with  . Nausea     HPI Pt was seen at 0350.   Per pt, c/o gradual onset and persistence of constant generalized abd "pain" for the past 4 to 5 days.  Has been associated with multiple intermittent episodes of N/V.  Describes the abd pain as "cramping."  Denies diarrhea, but does complain of one daily "loose" stool for the past several days. States his symptoms began "after I drank a few beers." Denies fevers, no back pain, no rash, no CP/SOB, no black or blood in stools or emesis.       Past Medical History  Diagnosis Date  . Hemorrhoid   . Hypertension   . Constipation   . Asthma   . Bronchitis   . GERD (gastroesophageal reflux disease)     History reviewed. No pertinent past surgical history.  Family History  Problem Relation Age of Onset  . Stroke Mother   . Hypertension Father   . Diabetes Father   . Breast cancer Sister   . Alcohol abuse Neg Hx   . Heart disease Neg Hx   . Hyperlipidemia Neg Hx   . Kidney disease Neg Hx     History  Substance Use Topics  . Smoking status: Never Smoker   . Smokeless tobacco: Never Used  . Alcohol Use: No      Review of Systems ROS: Statement: All systems negative except as marked or noted in the HPI; Constitutional: Negative for fever and chills. ; ; Eyes: Negative for eye pain, redness and discharge. ; ; ENMT: Negative for ear pain, hoarseness, nasal congestion, sinus pressure and sore throat. ; ; Cardiovascular: Negative for chest pain, palpitations, diaphoresis, dyspnea and peripheral edema. ; ; Respiratory: Negative for cough, wheezing and stridor. ; ; Gastrointestinal: +N/V, abd pain. Negative for diarrhea, blood in stool, hematemesis, jaundice and rectal bleeding. . ; ; Genitourinary: Negative for dysuria, flank pain and hematuria. ; ; Genital:  No penile drainage  or rash, no testicular pain or swelling, no scrotal rash or swelling.;; Musculoskeletal: Negative for back pain and neck pain. Negative for swelling and trauma.; ; Skin: Negative for pruritus, rash, abrasions, blisters, bruising and skin lesion.; ; Neuro: Negative for headache, lightheadedness and neck stiffness. Negative for weakness, altered level of consciousness , altered mental status, extremity weakness, paresthesias, involuntary movement, seizure and syncope.      Allergies  Shellfish allergy  Home Medications   Current Outpatient Rx  Name  Route  Sig  Dispense  Refill  . albuterol (PROVENTIL HFA;VENTOLIN HFA) 108 (90 BASE) MCG/ACT inhaler   Inhalation   Inhale 2 puffs into the lungs every 6 (six) hours as needed for wheezing. For wheezing or shortness of breath         . cetirizine (ZYRTEC) 10 MG tablet   Oral   Take 10 mg by mouth daily as needed for allergies. For allergies         . losartan (COZAAR) 50 MG tablet   Oral   Take 50 mg by mouth daily.           BP 163/98  Pulse 108  Temp(Src) 99.1 F (37.3 C) (Oral)  Resp 22  SpO2 95%  Physical Exam 0355: Physical examination:  Nursing notes reviewed; Vital signs and O2 SAT reviewed;  Constitutional: Well developed, Well nourished, Well hydrated, Uncomfortable appearing.; Head:  Normocephalic, atraumatic; Eyes: EOMI, PERRL, No scleral icterus; ENMT: Mouth and pharynx normal, Mucous membranes moist; Neck: Supple, Full range of motion, No lymphadenopathy; Cardiovascular: Regular rate and rhythm, No murmur, rub, or gallop; Respiratory: Breath sounds clear & equal bilaterally, No rales, rhonchi, wheezes.  Speaking full sentences with ease, Normal respiratory effort/excursion; Chest: Nontender, Movement normal; Abdomen: Soft, +mild diffuse tenderness to palp. No rebound or guarding. Nondistended, Normal bowel sounds; Genitourinary: No CVA tenderness; Extremities: Pulses normal, No tenderness, No edema, No calf edema or  asymmetry.; Neuro: AA&Ox3, Major CN grossly intact.  Speech clear. No gross focal motor or sensory deficits in extremities.; Skin: Color normal, Warm, Dry.   ED Course  Procedures     MDM  MDM Reviewed: previous chart, nursing note and vitals Reviewed previous: labs Interpretation: labs and CT scan   Results for orders placed during the hospital encounter of 03/31/13  COMPREHENSIVE METABOLIC PANEL      Result Value Range   Sodium 143  135 - 145 mEq/L   Potassium 4.9  3.5 - 5.1 mEq/L   Chloride 108  96 - 112 mEq/L   CO2 24  19 - 32 mEq/L   Glucose, Bld 111 (*) 70 - 99 mg/dL   BUN 15  6 - 23 mg/dL   Creatinine, Ser 4.09  0.50 - 1.35 mg/dL   Calcium 8.9  8.4 - 81.1 mg/dL   Total Protein 8.2  6.0 - 8.3 g/dL   Albumin 4.4  3.5 - 5.2 g/dL   AST 28  0 - 37 U/L   ALT 38  0 - 53 U/L   Alkaline Phosphatase 75  39 - 117 U/L   Total Bilirubin 0.9  0.3 - 1.2 mg/dL   GFR calc non Af Amer 68 (*) >90 mL/min   GFR calc Af Amer 78 (*) >90 mL/min  CBC WITH DIFFERENTIAL      Result Value Range   WBC 9.6  4.0 - 10.5 K/uL   RBC 4.95  4.22 - 5.81 MIL/uL   Hemoglobin 15.3  13.0 - 17.0 g/dL   HCT 91.4  78.2 - 95.6 %   MCV 88.7  78.0 - 100.0 fL   MCH 30.9  26.0 - 34.0 pg   MCHC 34.9  30.0 - 36.0 g/dL   RDW 21.3  08.6 - 57.8 %   Platelets 309  150 - 400 K/uL   Neutrophils Relative 86 (*) 43 - 77 %   Neutro Abs 8.3 (*) 1.7 - 7.7 K/uL   Lymphocytes Relative 7 (*) 12 - 46 %   Lymphs Abs 0.7  0.7 - 4.0 K/uL   Monocytes Relative 5  3 - 12 %   Monocytes Absolute 0.5  0.1 - 1.0 K/uL   Eosinophils Relative 1  0 - 5 %   Eosinophils Absolute 0.1  0.0 - 0.7 K/uL   Basophils Relative 0  0 - 1 %   Basophils Absolute 0.0  0.0 - 0.1 K/uL  LIPASE, BLOOD      Result Value Range   Lipase 23  11 - 59 U/L  ETHANOL      Result Value Range   Alcohol, Ethyl (B) <11  0 - 11 mg/dL   Ct Abdomen Pelvis W Contrast 03/31/2013  *RADIOLOGY REPORT*  Clinical Data: Right lower quadrant pain.  Vomiting.  CT ABDOMEN  AND PELVIS WITH CONTRAST  Technique:  Multidetector CT imaging of the abdomen and  pelvis was performed following the standard protocol during bolus administration of intravenous contrast.  Contrast: OMNIPAQUE IOHEXOL 300 MG/ML  SOLN  Comparison: CT pelvis 05/22/2009.  Findings: Dependent atelectasis is seen in the lung bases.  No pleural or pericardial effusion.  The patient suffered an episode of vomiting during scanning resulting in motion and some degradation of the study.  The appendix is well visualized and normal in appearance.  There is no fluid or lymphadenopathy.  The stomach and small and large bowel appear normal.  The liver, spleen, gallbladder, adrenal glands and right kidney appear normal. Small low attenuating lesion in the mid pole of the left kidney is most consistent with a cyst.  There is a fluid attenuation lesion in the mesenteric fat with Hounsfield unit measurements of 0.  The lesion measures 3.8 cm cranial-caudal by 2.5 cm AP by 4.3 cm transverse. No lymphadenopathy is identified. No focal bony abnormality is seen.  IMPRESSION:  1.  Negative for appendicitis or acute abnormality. 2.  Small cystic structure in the mesentery is noted and has benign features. 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   Original Report Authenticated By: Holley Dexter, M.D.     [phone]:  Pt has tol PO well while in the ED without N/V.  No stooling while in the ED.  Abd benign, VSS. Wants to go home now. Dx and testing d/w pt.  Questions answered.  Verb understanding, agreeable to d/c home with outpt f/u.           Laray Anger, DO 04/03/13 [phone]

## 2013-04-04 NOTE — ED Notes (Signed)
Patient informed of positive results after id'd x 2 and informed of need to notify partner to be treated. 

## 2013-04-05 ENCOUNTER — Emergency Department (HOSPITAL_COMMUNITY)
Admission: EM | Admit: 2013-04-05 | Discharge: 2013-04-05 | Discharge status: Against Medical Advice | Payer: Self-pay | Attending: Emergency Medicine | Admitting: Emergency Medicine

## 2013-04-05 ENCOUNTER — Encounter (HOSPITAL_COMMUNITY): Payer: Self-pay | Admitting: Cardiology

## 2013-04-05 DIAGNOSIS — R5383 Other fatigue: Secondary | ICD-10-CM | POA: Insufficient documentation

## 2013-04-05 DIAGNOSIS — R6889 Other general symptoms and signs: Secondary | ICD-10-CM | POA: Insufficient documentation

## 2013-04-05 DIAGNOSIS — K6289 Other specified diseases of anus and rectum: Secondary | ICD-10-CM | POA: Insufficient documentation

## 2013-04-05 DIAGNOSIS — R5381 Other malaise: Secondary | ICD-10-CM | POA: Insufficient documentation

## 2013-04-05 NOTE — ED Notes (Signed)
No reply from waiting room

## 2013-04-05 NOTE — ED Notes (Signed)
Called for pt to be taken to treatment room no answer.

## 2013-04-05 NOTE — ED Notes (Signed)
Pt reports generalized fatigue and reports that he feels like he smells like diarrhea all the time. Also reports that he has been having rectal pain and leaking from his rectum. Also reports bad breath and states that he is worried about diabetes. Pt reports that he feels anxious.

## 2013-04-05 NOTE — ED Notes (Signed)
No response from pt x 3

## 2013-05-16 ENCOUNTER — Emergency Department (HOSPITAL_COMMUNITY)
Admission: EM | Admit: 2013-05-16 | Discharge: 2013-05-16 | Disposition: A | Discharge status: Home / Self Care | Payer: Self-pay | Attending: Emergency Medicine | Admitting: Emergency Medicine

## 2013-05-16 ENCOUNTER — Encounter (HOSPITAL_COMMUNITY): Payer: Self-pay | Admitting: Emergency Medicine

## 2013-05-16 DIAGNOSIS — I10 Essential (primary) hypertension: Secondary | ICD-10-CM | POA: Insufficient documentation

## 2013-05-16 DIAGNOSIS — J45909 Unspecified asthma, uncomplicated: Secondary | ICD-10-CM | POA: Insufficient documentation

## 2013-05-16 DIAGNOSIS — L299 Pruritus, unspecified: Secondary | ICD-10-CM | POA: Insufficient documentation

## 2013-05-16 DIAGNOSIS — B354 Tinea corporis: Secondary | ICD-10-CM

## 2013-05-16 DIAGNOSIS — Z8709 Personal history of other diseases of the respiratory system: Secondary | ICD-10-CM | POA: Insufficient documentation

## 2013-05-16 DIAGNOSIS — Z8679 Personal history of other diseases of the circulatory system: Secondary | ICD-10-CM | POA: Insufficient documentation

## 2013-05-16 DIAGNOSIS — R198 Other specified symptoms and signs involving the digestive system and abdomen: Secondary | ICD-10-CM | POA: Insufficient documentation

## 2013-05-16 DIAGNOSIS — Z8619 Personal history of other infectious and parasitic diseases: Secondary | ICD-10-CM | POA: Insufficient documentation

## 2013-05-16 DIAGNOSIS — Z8719 Personal history of other diseases of the digestive system: Secondary | ICD-10-CM | POA: Insufficient documentation

## 2013-05-16 DIAGNOSIS — Z79899 Other long term (current) drug therapy: Secondary | ICD-10-CM | POA: Insufficient documentation

## 2013-05-16 LAB — GLUCOSE, CAPILLARY: Glucose-Capillary: 111 mg/dL — ABNORMAL HIGH (ref 70–99)

## 2013-05-16 MED ORDER — TOLNAFTATE 1 % EX POWD: Freq: Two times a day (BID) | CUTANEOUS | Status: DC

## 2013-05-16 NOTE — ED Notes (Signed)
Pt dc'd home w/all belongings, alert and ambulatory upon dc, pt verbalizes understanding of dc instructions, drove self home no narcotics given in ED

## 2013-05-16 NOTE — ED Notes (Signed)
Pt c/o rash to legs and under arms x years; pt requests CBG check

## 2013-05-16 NOTE — ED Provider Notes (Signed)
History    This chart was scribed for non-physician practitioner, Lucretia Kern, working with Doug Sou, MD by Donne Anon, ED Scribe. This patient was seen in room TR05C/TR05C and the patient's care was started at 1507.  CSN: [id number] Arrival date & time 05/16/13  1430  First MD Initiated Contact with Patient 05/16/13 1507     Chief Complaint  Patient presents with  . Rash    The history is provided by the patient. No language interpreter was used.   HPI Comments: Thomas Holloway is a 42 y.o. male with hx of yeast infections and metabolic syndrome, who presents to the Emergency Department complaining of 2 days of gradual onset, gradually worsening, intermittent, recurrent itchy rash and foul odor to his buttock which began after a bowel movement. He also reports rash under arms and nose crease that produce a similar smell as well as pain with bowel movements. He has had similar episodes several times over the past 5 years, and these patches generally occur in his creases. He has tried washing 2x per day and warm soaks with little relief. He denies using any creams.   He also reports a foul odor coming from his mouth and has a hx of infected tonsils.    He does not currently have a PCP.    Past Medical History  Diagnosis Date  . Hemorrhoid   . Hypertension   . Constipation   . Asthma   . Bronchitis   . GERD (gastroesophageal reflux disease)    History reviewed. No pertinent past surgical history. Family History  Problem Relation Age of Onset  . Stroke Mother   . Hypertension Father   . Diabetes Father   . Breast cancer Sister   . Alcohol abuse Neg Hx   . Heart disease Neg Hx   . Hyperlipidemia Neg Hx   . Kidney disease Neg Hx    History  Substance Use Topics  . Smoking status: Never Smoker   . Smokeless tobacco: Never Used  . Alcohol Use: Yes    Review of Systems  Skin: Positive for rash.  All other systems reviewed and are  negative.    Allergies  Shellfish allergy  Home Medications   Current Outpatient Rx  Name  Route  Sig  Dispense  Refill  . albuterol (PROVENTIL HFA;VENTOLIN HFA) 108 (90 BASE) MCG/ACT inhaler   Inhalation   Inhale 2 puffs into the lungs every 6 (six) hours as needed for wheezing or shortness of breath.           BP 172/101  Pulse 105  Temp(Src) 98.4 F (36.9 C) (Oral)  Resp 18  SpO2 95% Physical Exam  Nursing note and vitals reviewed. Constitutional: He appears well-developed and well-nourished. No distress.  HENT:  Head: Normocephalic and atraumatic.  Bilaterally enlarged tonsils. There are white tonsilloliths.   Eyes: Conjunctivae are normal.  Neck: Neck supple. No tracheal deviation present.  Cardiovascular: Normal rate.   Pulmonary/Chest: Effort normal. No respiratory distress.  Musculoskeletal: Normal range of motion.  Neurological: He is alert.  Skin: Skin is warm and dry.  Hyperpigmented flat rash in bilateral axilla and crease of the buttock. No tenderness. No induration. No drainage. No ulcers or vesicles.   Psychiatric: He has a normal mood and affect. His behavior is normal.    ED Course  Procedures (including critical care time) DIAGNOSTIC STUDIES: Oxygen Saturation is 95% on RA, adequate by my interpretation.    COORDINATION OF CARE: 3:34  PM Discussed treatment plan which includes an antifungal powder with pt at bedside and pt agreed to plan. Advised pt to keep skin clean and dry. Will give a resource guide.    Labs Reviewed  GLUCOSE, CAPILLARY - Abnormal; Notable for the following:    Glucose-Capillary 111 (*)    All other components within normal limits   No results found. No diagnosis found.  MDM  Pt with most likely tinea infection of axilla and buttock. Will start on tinactin powder. Follow up with PCP. BP elevated in ED, will need close follow up for recheck.     I personally performed the services described in this documentation,  which was scribed in my presence. The recorded information has been reviewed and is accurate.    Lottie Mussel, PA-C 05/16/13 1605

## 2013-05-16 NOTE — ED Notes (Signed)
Pt reports he was dx a while back with HTN but quit taking his medication bc he pressure was getting better, pt denies having a pcp, also denies having any HTN medication at home

## 2013-05-17 NOTE — ED Provider Notes (Signed)
Medical screening examination/treatment/procedure(s) were performed by non-physician practitioner and as supervising physician I was immediately available for consultation/collaboration.  Doug Sou, MD 05/17/13 [id number]

## 2013-10-11 ENCOUNTER — Emergency Department (HOSPITAL_COMMUNITY)
Admission: EM | Admit: 2013-10-11 | Discharge: 2013-10-11 | Disposition: A | Discharge status: Home / Self Care | Payer: BC Managed Care – PPO | Attending: Emergency Medicine | Admitting: Emergency Medicine

## 2013-10-11 ENCOUNTER — Encounter (HOSPITAL_COMMUNITY): Payer: Self-pay | Admitting: Emergency Medicine

## 2013-10-11 DIAGNOSIS — J45909 Unspecified asthma, uncomplicated: Secondary | ICD-10-CM | POA: Insufficient documentation

## 2013-10-11 DIAGNOSIS — Z79899 Other long term (current) drug therapy: Secondary | ICD-10-CM | POA: Insufficient documentation

## 2013-10-11 DIAGNOSIS — K589 Irritable bowel syndrome without diarrhea: Secondary | ICD-10-CM | POA: Insufficient documentation

## 2013-10-11 DIAGNOSIS — I10 Essential (primary) hypertension: Secondary | ICD-10-CM | POA: Insufficient documentation

## 2013-10-11 DIAGNOSIS — R32 Unspecified urinary incontinence: Secondary | ICD-10-CM | POA: Insufficient documentation

## 2013-10-11 DIAGNOSIS — K5900 Constipation, unspecified: Secondary | ICD-10-CM | POA: Insufficient documentation

## 2013-10-11 LAB — COMPREHENSIVE METABOLIC PANEL
ALT: 16 U/L (ref 0–53)
Alkaline Phosphatase: 80 U/L (ref 39–117)
GFR calc Af Amer: >90 mL/min (ref >90)
Glucose, Bld: 98 mg/dL (ref 70–99)
Potassium: 3.8 mEq/L (ref 3.5–5.1)
Sodium: 140 mEq/L (ref 135–145)
Total Protein: 7.5 g/dL (ref 6.0–8.3)

## 2013-10-11 LAB — CBC WITH DIFFERENTIAL/PLATELET
Eosinophils Absolute: 0.1 10*3/uL (ref 0.0–0.7)
Lymphocytes Relative: 27 % (ref 12–46)
Lymphs Abs: 2 10*3/uL (ref 0.7–4.0)
MCH: 31.3 pg (ref 26.0–34.0)
Neutrophils Relative %: 61 % (ref 43–77)
Platelets: 290 10*3/uL (ref 150–400)
RBC: 4.76 MIL/uL (ref 4.22–5.81)
WBC: 7.3 10*3/uL (ref 4.0–10.5)

## 2013-10-11 LAB — URINALYSIS, ROUTINE W REFLEX MICROSCOPIC
Bilirubin Urine: NEGATIVE
Glucose, UA: NEGATIVE mg/dL
Hgb urine dipstick: NEGATIVE
Ketones, ur: NEGATIVE mg/dL
Nitrite: NEGATIVE
pH: 7 (ref 5.0–8.0)

## 2013-10-11 MED ORDER — HYOSCYAMINE SULFATE 0.125 MG SL SUBL: 0.1250 mg | SUBLINGUAL_TABLET | SUBLINGUAL | Status: DC | PRN

## 2013-10-11 NOTE — ED Provider Notes (Signed)
CSN: [id number]     Arrival date & time 10/11/13  1030 History   First MD Initiated Contact with Patient 10/11/13 1033     Chief Complaint  Patient presents with  . Abdominal Pain    HPI Patient presents to emergency room with complaints of abdominal cramping associated with fecal and urinary incontinence over the last 4 years. Patient states intermittently he has episodes where he feels like he soiled his underwear. He states that he often smells like stool. Patient states intermittently he also has urinary incontinence.  Patient states these episodes happen intermittently every few days. Has some intermittent abdominal cramping. He denies any fever and has not noticed any blood in his stool. The patient denies any trouble with chest pain or abdominal pain. He denies any back pain although he feels like his legs to get weak intermittently.  Patient states he lost his job and was told that he no longer work because he smelled of stool. Patient saw primary care Dr. for this previously and was told possibility that he should stop working. Patient came to the emergency room today because he was looking to get a note indicating that he could not work because of his medical condition.  Patient denies any fevers or pain at this time he has no difficulty with his balance or coordination Past Medical History  Diagnosis Date  . Hemorrhoid   . Hypertension   . Constipation   . Asthma   . Bronchitis   . GERD (gastroesophageal reflux disease)    History reviewed. No pertinent past surgical history. Family History  Problem Relation Age of Onset  . Stroke Mother   . Hypertension Father   . Diabetes Father   . Breast cancer Sister   . Alcohol abuse Neg Hx   . Heart disease Neg Hx   . Hyperlipidemia Neg Hx   . Kidney disease Neg Hx    History  Substance Use Topics  . Smoking status: Never Smoker   . Smokeless tobacco: Never Used  . Alcohol Use: Yes    Review of Systems  Constitutional:  Negative for fever.  Gastrointestinal: Positive for constipation. Negative for nausea and vomiting.  All other systems reviewed and are negative.    Allergies  Shellfish allergy  Home Medications   Current Outpatient Rx  Name  Route  Sig  Dispense  Refill  . albuterol (PROVENTIL HFA;VENTOLIN HFA) 108 (90 BASE) MCG/ACT inhaler   Inhalation   Inhale 2 puffs into the lungs every 6 (six) hours as needed for wheezing or shortness of breath.          . losartan (COZAAR) 50 MG tablet   Oral   Take 50 mg by mouth daily.         . hyoscyamine (LEVSIN/SL) 0.125 MG SL tablet   Sublingual   Place 1 tablet (0.125 mg total) under the tongue every 4 (four) hours as needed.   30 tablet   0    BP 151/92  Pulse 58  Temp(Src) 97.3 F (36.3 C) (Oral)  Resp 16  Ht 5\' 9"  (1.753 m)  Wt 285 lb (129.275 kg)  BMI 42.07 kg/m2  SpO2 100% Physical Exam  Nursing note and vitals reviewed. Constitutional: He appears well-developed and well-nourished. No distress.  The patient is not malodorous, he does not smell of stool  HENT:  Head: Normocephalic and atraumatic.  Right Ear: External ear normal.  Left Ear: External ear normal.  No halitosis  Eyes: Conjunctivae are  normal. Right eye exhibits no discharge. Left eye exhibits no discharge. No scleral icterus.  Neck: Neck supple. No tracheal deviation present.  Cardiovascular: Normal rate, regular rhythm and intact distal pulses.   Pulmonary/Chest: Effort normal and breath sounds normal. No stridor. No respiratory distress. He has no wheezes. He has no rales.  Abdominal: Soft. Bowel sounds are normal. He exhibits no distension. There is no tenderness. There is no rebound and no guarding.  Genitourinary: Rectum normal. Rectal exam shows no fissure, no mass, no tenderness and anal tone normal. Guaiac negative stool.  Normal rectal tone no incontinence noted  Musculoskeletal: He exhibits no edema and no tenderness.  Neurological: He is alert. He  has normal strength. No sensory deficit. Cranial nerve deficit:  no gross defecits noted. He exhibits normal muscle tone. He displays no seizure activity. Coordination normal.  Reflex Scores:      Patellar reflexes are 2+ on the right side and 2+ on the left side.      Achilles reflexes are 2+ on the right side and 2+ on the left side. Skin: Skin is warm and dry. No rash noted.  Psychiatric: He has a normal mood and affect.    ED Course  Procedures (including critical care time) Labs Review Labs Reviewed  CBC WITH DIFFERENTIAL  COMPREHENSIVE METABOLIC PANEL  URINALYSIS, ROUTINE W REFLEX MICROSCOPIC  OCCULT BLOOD X 1 CARD TO LAB, STOOL   Imaging Review No results found.  EKG Interpretation   None       previous records were reviewed. Patient's mentioned these symptoms to his primary doctor back in 2010 according to the electronic medical record. Followup with GI was recommended. Old records indicate he was prescribed Levsin at some point suggesting he may have been diagnosed with IBS  MDM   1. IBS (irritable bowel syndrome)    The patient's exam and evaluation here in the emergency department is reassuring. The patient has normal rectal tone. He does not demonstrate any evidence of incontinence here in the emergency department. There is nothing to suggest some type of spinal issue. The patient has normal reflexes in lower extremity exam.  Looking through his records, the patient has a history of this documented previously. Possible that he may have irritable bowel syndrome. Will discharge him home on a trial of Levsin. I recommend followup with primary Dr. for further evaluation  At this time there does not appear to be any evidence of an acute emergency medical condition and the patient appears stable for discharge with appropriate outpatient follow up.    Celene Kras, MD 10/11/13 [phone]

## 2013-10-11 NOTE — ED Notes (Signed)
Pt. Having generalized abdominal pain 3-4 years.  Also has periods of incontinence and periods of loose stool on himself.  Happens daily.   Pt. Also reports that his breath and sweat smells like feces.

## 2013-10-25 ENCOUNTER — Ambulatory Visit: Payer: Self-pay | Admitting: Internal Medicine

## 2013-10-25 DIAGNOSIS — Z0289 Encounter for other administrative examinations: Secondary | ICD-10-CM

## 2013-11-24 ENCOUNTER — Encounter (HOSPITAL_COMMUNITY): Payer: Self-pay | Admitting: Emergency Medicine

## 2013-11-24 ENCOUNTER — Emergency Department (HOSPITAL_COMMUNITY)
Admission: EM | Admit: 2013-11-24 | Discharge: 2013-11-25 | Disposition: A | Discharge status: Home / Self Care | Payer: BC Managed Care – PPO | Attending: Emergency Medicine | Admitting: Emergency Medicine

## 2013-11-24 DIAGNOSIS — R109 Unspecified abdominal pain: Secondary | ICD-10-CM

## 2013-11-24 DIAGNOSIS — I10 Essential (primary) hypertension: Secondary | ICD-10-CM | POA: Insufficient documentation

## 2013-11-24 DIAGNOSIS — Z8719 Personal history of other diseases of the digestive system: Secondary | ICD-10-CM | POA: Insufficient documentation

## 2013-11-24 DIAGNOSIS — J45909 Unspecified asthma, uncomplicated: Secondary | ICD-10-CM | POA: Insufficient documentation

## 2013-11-24 DIAGNOSIS — R519 Headache, unspecified: Secondary | ICD-10-CM

## 2013-11-24 DIAGNOSIS — R1084 Generalized abdominal pain: Secondary | ICD-10-CM | POA: Insufficient documentation

## 2013-11-24 DIAGNOSIS — R1110 Vomiting, unspecified: Secondary | ICD-10-CM

## 2013-11-24 DIAGNOSIS — R51 Headache: Secondary | ICD-10-CM | POA: Insufficient documentation

## 2013-11-24 LAB — CBC WITH DIFFERENTIAL/PLATELET
BASOS ABS: 0 10*3/uL (ref 0.0–0.1)
BASOS PCT: 0 % (ref 0–1)
Eosinophils Absolute: 0.1 10*3/uL (ref 0.0–0.7)
Eosinophils Relative: 1 % (ref 0–5)
HCT: 44.2 % (ref 39.0–52.0)
Hemoglobin: 15.1 g/dL (ref 13.0–17.0)
LYMPHS PCT: 19 % (ref 12–46)
Lymphs Abs: 1.6 10*3/uL (ref 0.7–4.0)
MCH: 30.8 pg (ref 26.0–34.0)
MCHC: 34.2 g/dL (ref 30.0–36.0)
MCV: 90 fL (ref 78.0–100.0)
MONO ABS: 0.5 10*3/uL (ref 0.1–1.0)
Monocytes Relative: 6 % (ref 3–12)
NEUTROS ABS: 6.3 10*3/uL (ref 1.7–7.7)
NEUTROS PCT: 74 % (ref 43–77)
PLATELETS: 295 10*3/uL (ref 150–400)
RBC: 4.91 MIL/uL (ref 4.22–5.81)
RDW: 12 % (ref 11.5–15.5)
WBC: 8.5 10*3/uL (ref 4.0–10.5)

## 2013-11-24 LAB — URINALYSIS, ROUTINE W REFLEX MICROSCOPIC
BILIRUBIN URINE: NEGATIVE
Glucose, UA: NEGATIVE mg/dL
Hgb urine dipstick: NEGATIVE
KETONES UR: NEGATIVE mg/dL
LEUKOCYTES UA: NEGATIVE
Nitrite: NEGATIVE
PH: 7.5 (ref 5.0–8.0)
PROTEIN: NEGATIVE mg/dL
Specific Gravity, Urine: 1.026 (ref 1.005–1.030)
UROBILINOGEN UA: 0.2 mg/dL (ref 0.0–1.0)

## 2013-11-24 LAB — COMPREHENSIVE METABOLIC PANEL
ALK PHOS: 74 U/L (ref 39–117)
ALT: 24 U/L (ref 0–53)
AST: 21 U/L (ref 0–37)
Albumin: 3.8 g/dL (ref 3.5–5.2)
BUN: 9 mg/dL (ref 6–23)
CO2: 25 meq/L (ref 19–32)
Calcium: 8.9 mg/dL (ref 8.4–10.5)
Chloride: 103 mEq/L (ref 96–112)
Creatinine, Ser: 0.92 mg/dL (ref 0.50–1.35)
GLUCOSE: 105 mg/dL — AB (ref 70–99)
POTASSIUM: 4 meq/L (ref 3.7–5.3)
Sodium: 140 mEq/L (ref 137–147)
Total Bilirubin: 0.5 mg/dL (ref 0.3–1.2)
Total Protein: 7.8 g/dL (ref 6.0–8.3)

## 2013-11-24 MED ORDER — SODIUM CHLORIDE 0.9 % IV BOLUS (SEPSIS)
1000.0000 mL | Freq: Once | INTRAVENOUS | Status: AC
  Administered 2013-11-24: 1000 mL via INTRAVENOUS

## 2013-11-24 MED ORDER — KETOROLAC TROMETHAMINE 30 MG/ML IJ SOLN
30.0000 mg | Freq: Once | INTRAMUSCULAR | Status: AC
  Administered 2013-11-24: 30 mg via INTRAVENOUS
  Filled 2013-11-24: qty 1

## 2013-11-24 MED ORDER — FAMOTIDINE IN NACL 20-0.9 MG/50ML-% IV SOLN
20.0000 mg | Freq: Once | INTRAVENOUS | Status: AC
  Administered 2013-11-24: 20 mg via INTRAVENOUS
  Filled 2013-11-24: qty 50

## 2013-11-24 MED ORDER — METOCLOPRAMIDE HCL 5 MG/ML IJ SOLN
10.0000 mg | Freq: Once | INTRAMUSCULAR | Status: AC
  Administered 2013-11-24: 10 mg via INTRAVENOUS
  Filled 2013-11-24: qty 2

## 2013-11-24 MED ORDER — DIPHENHYDRAMINE HCL 50 MG/ML IJ SOLN
25.0000 mg | Freq: Once | INTRAMUSCULAR | Status: AC
  Administered 2013-11-24: 25 mg via INTRAVENOUS
  Filled 2013-11-24: qty 1

## 2013-11-24 NOTE — ED Provider Notes (Signed)
CSN: [id number]     Arrival date & time 11/24/13  1824 History   First MD Initiated Contact with Patient 11/24/13 2301     Chief Complaint  Patient presents with  . Emesis   (Consider location/radiation/quality/duration/timing/severity/associated sxs/prior Treatment) The history is provided by the patient.  Thomas Holloway is a 43 y.o. male hx of HTN, constipation, possible IBS here with abdominal pain and vomiting. Diffuse abdominal pain intermittently for several months. Got worse today. He vomited three times and had some headaches. Denies fevers or chills or urinary symptoms. He was seen here 2 months ago and was thought to have worsening IBS and was prescribed Levsin but he didn't fill his script. Hasn't seen a GI doctor recently.    Past Medical History  Diagnosis Date  . Hemorrhoid   . Hypertension   . Constipation   . Asthma   . Bronchitis   . GERD (gastroesophageal reflux disease)    History reviewed. No pertinent past surgical history. Family History  Problem Relation Age of Onset  . Stroke Mother   . Hypertension Father   . Diabetes Father   . Breast cancer Sister   . Alcohol abuse Neg Hx   . Heart disease Neg Hx   . Hyperlipidemia Neg Hx   . Kidney disease Neg Hx    History  Substance Use Topics  . Smoking status: Never Smoker   . Smokeless tobacco: Never Used  . Alcohol Use: Yes    Review of Systems  Gastrointestinal: Positive for vomiting and abdominal pain.  All other systems reviewed and are negative.    Allergies  Shellfish allergy  Home Medications  No current outpatient prescriptions on file. BP 158/90  Pulse 57  Temp(Src) 98.3 F (36.8 C) (Oral)  Resp 16  Ht 5\' 10"  (1.778 m)  Wt 280 lb (127.007 kg)  BMI 40.18 kg/m2  SpO2 98% Physical Exam  Nursing note and vitals reviewed. Constitutional: He is oriented to person, place, and time.  Vomiting, slightly dehydrated   HENT:  Head: Normocephalic.  MM slightly dry   Eyes: Conjunctivae are  normal. Pupils are equal, round, and reactive to light.  Neck: Normal range of motion. Neck supple.  Cardiovascular: Normal rate, regular rhythm and normal heart sounds.   Pulmonary/Chest: Effort normal and breath sounds normal. No respiratory distress. He has no wheezes. He has no rales.  Abdominal: Soft. Bowel sounds are normal.  Mild diffuse tenderness, no rebound   Musculoskeletal: Normal range of motion. He exhibits no edema and no tenderness.  Neurological: He is alert and oriented to person, place, and time. No cranial nerve deficit. Coordination normal.  Skin: Skin is warm and dry.  Psychiatric: He has a normal mood and affect. His behavior is normal. Judgment and thought content normal.    ED Course  Procedures (including critical care time) Labs Review Labs Reviewed  COMPREHENSIVE METABOLIC PANEL - Abnormal; Notable for the following:    Glucose, Bld 105 (*)    All other components within normal limits  CBC WITH DIFFERENTIAL  URINALYSIS, ROUTINE W REFLEX MICROSCOPIC   Imaging Review No results found.  EKG Interpretation   None       MDM  No diagnosis found. Thomas LoraMelvin Neiswonger is a 43 y.o. male hx of HTN possible IBS here with ab pain, vomiting. Nonfocal abdominal exam. Labs nl. Will hydrate and give antiemetic. Likely combination of IBS and reflux.   1:36 AM Felt better after reglan and toradol. Will dc home with  reglan, Levsin, GI f/u.   Richardean Canal, MD 11/25/13 [phone]

## 2013-11-24 NOTE — ED Notes (Signed)
Pt c/o headache, abd pain and vomiting since this am. States he vomited x 3. He has not felt like eating all day

## 2013-11-25 MED ORDER — HYOSCYAMINE SULFATE 0.125 MG SL SUBL: 0.1250 mg | SUBLINGUAL_TABLET | SUBLINGUAL | Status: DC | PRN

## 2013-11-25 MED ORDER — METOCLOPRAMIDE HCL 10 MG PO TABS: 10.0000 mg | ORAL_TABLET | Freq: Four times a day (QID) | ORAL | Status: DC | PRN

## 2013-11-25 NOTE — Discharge Instructions (Signed)
Take reglan for nausea and stay hydrated.   Take levsin for pain.   You need to see a GI doctor.   Return to ER if you have worse pain, vomiting, dehydration.

## 2014-03-14 IMAGING — CT CT ABD-PELV W/ CM
2 of 5 series · 14 of 32 positions shown, 19 images · IV contrast (water/omni  & 100ml omni 300)
Comparison: CT pelvis 05/22/2009.

CLINICAL DATA: Right lower quadrant pain.  Vomiting.

CT ABDOMEN AND PELVIS WITH CONTRAST
TECHNIQUE: Multidetector CT imaging of the abdomen and pelvis was
performed following the standard protocol during bolus
administration of intravenous contrast.
Contrast: 100mL OMNIPAQUE IOHEXOL 300 MG/ML  SOLN

[Series 2: abd,pelvis · axial · 0.97mm/px · z∈[-346,+22]mm · 7 of 176 slices shown, 12 images]
[im 22/176  soft-tissue]
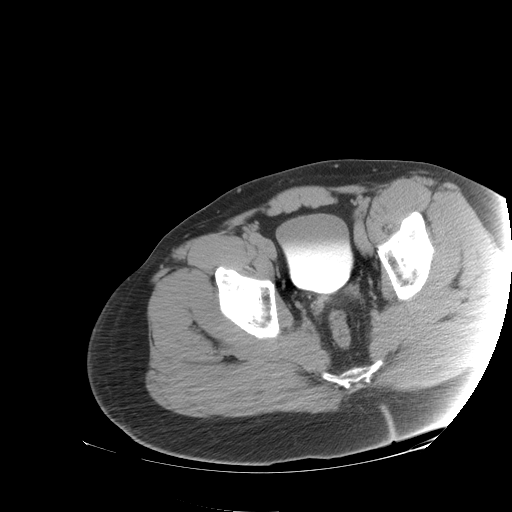
[im 22/176  bone]
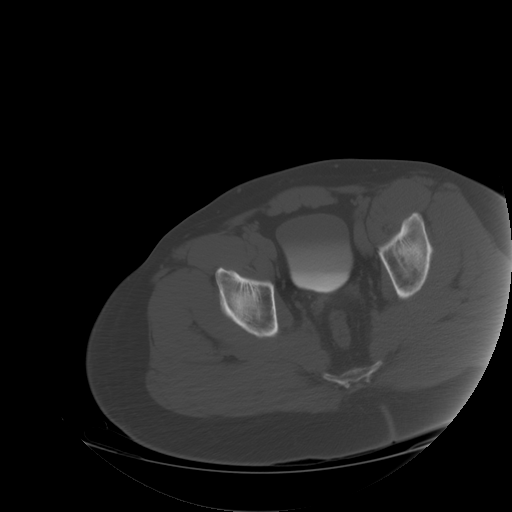
[im 44/176  soft-tissue]
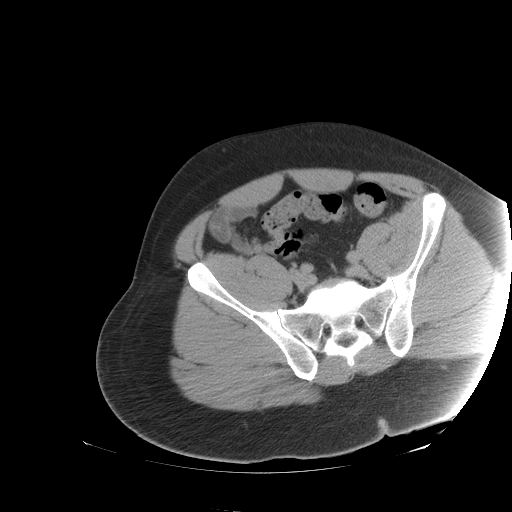
[im 66/176  soft-tissue]
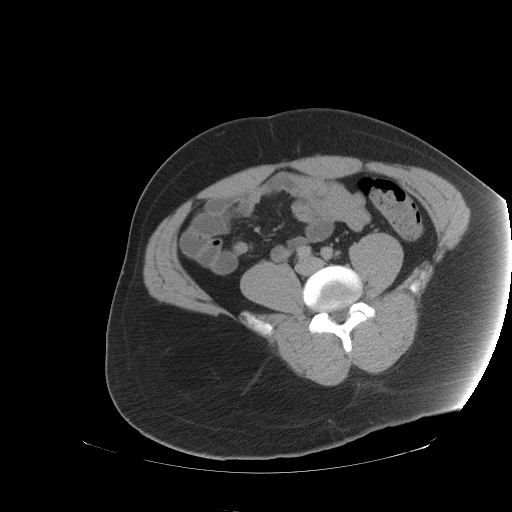
[im 88/176  soft-tissue]
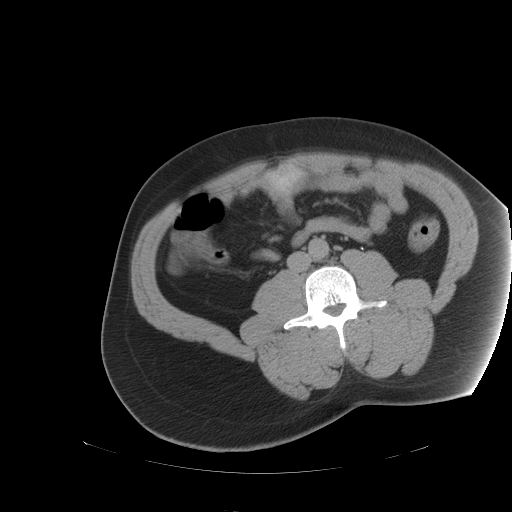
[im 88/176  lung]
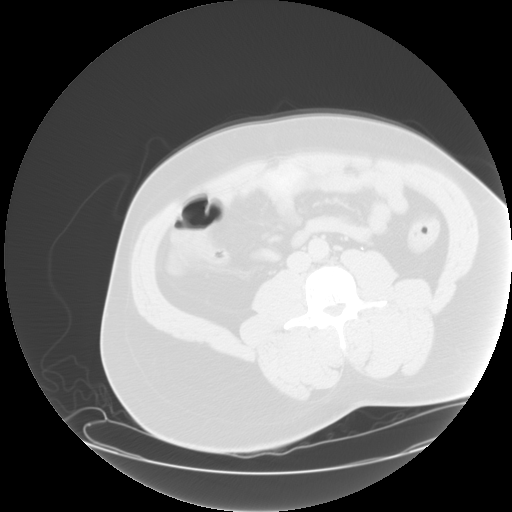
[im 110/176  soft-tissue]
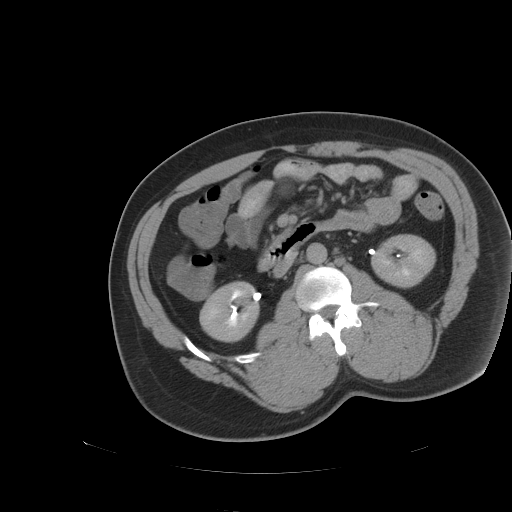
[im 110/176  lung]
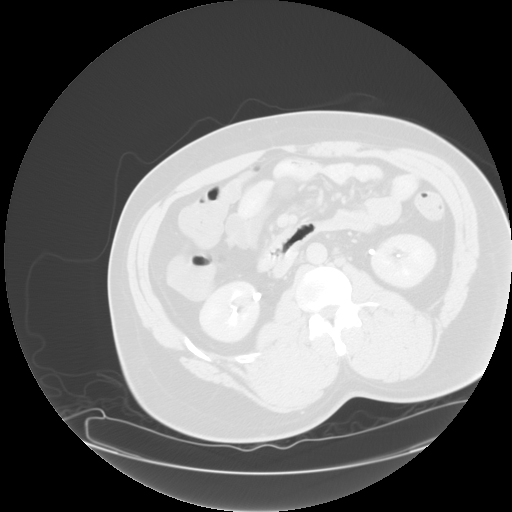
[im 132/176  soft-tissue]
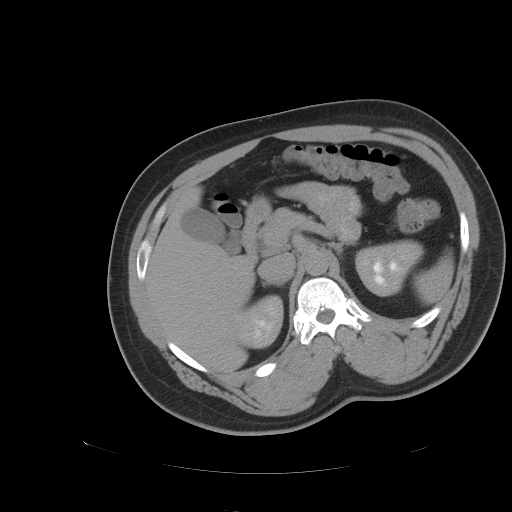
[im 132/176  lung]
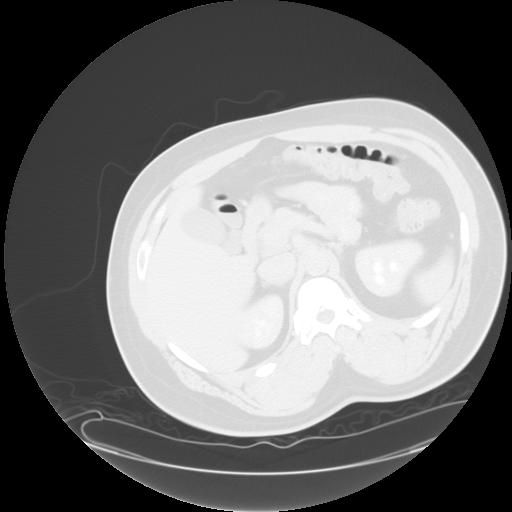
[im 154/176  soft-tissue]
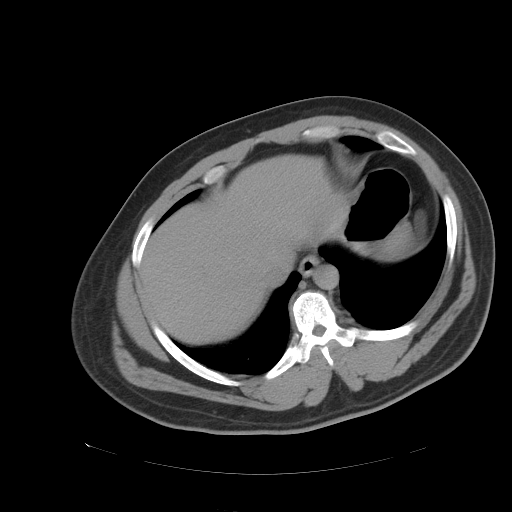
[im 154/176  lung]
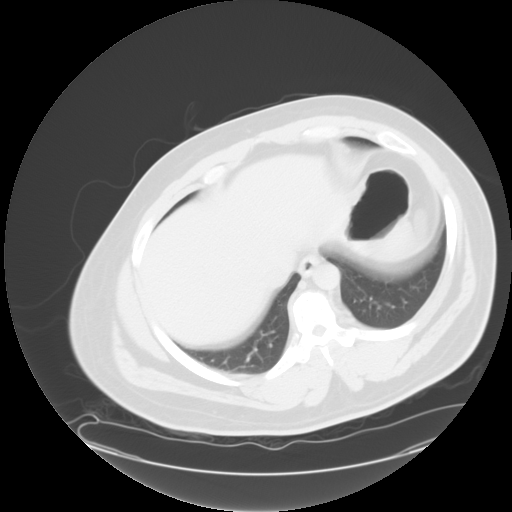

[Series 105: axials · axial · 0.96mm/px · z∈[-390,+12]mm · 7 of 180 slices shown]
[im 23/180  soft-tissue]
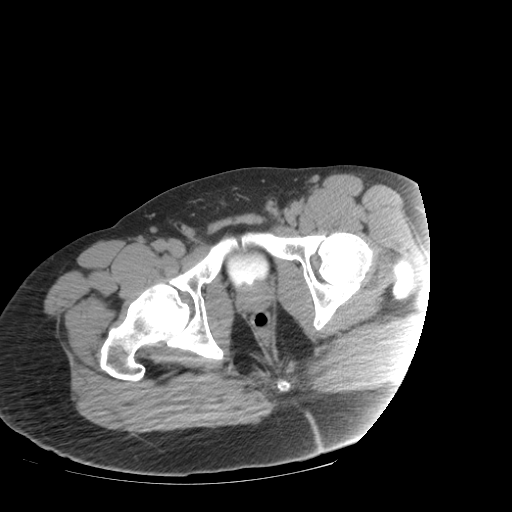
[im 45/180  soft-tissue]
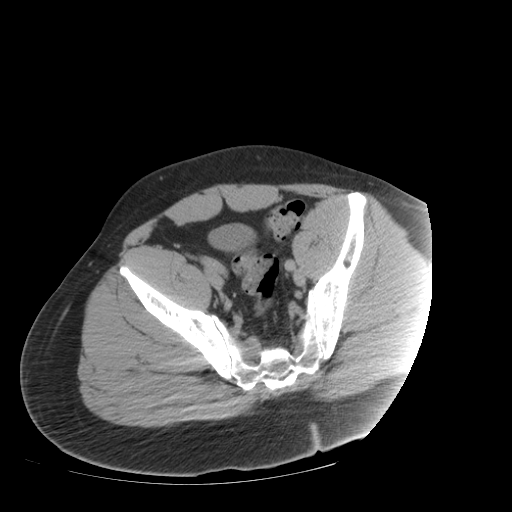
[im 68/180  soft-tissue]
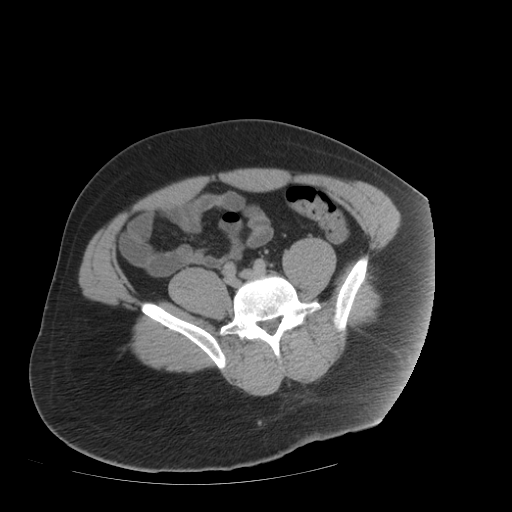
[im 90/180  soft-tissue]
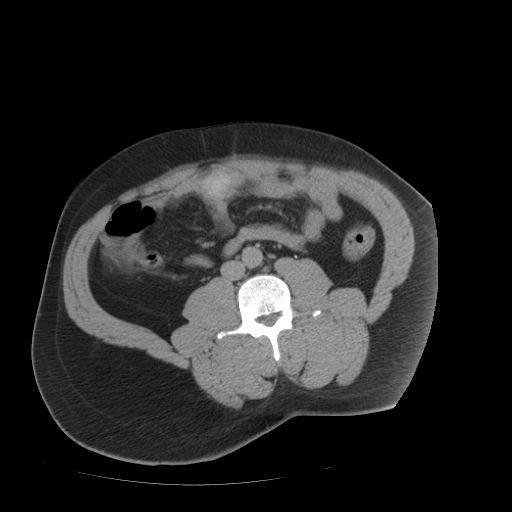
[im 112/180  soft-tissue]
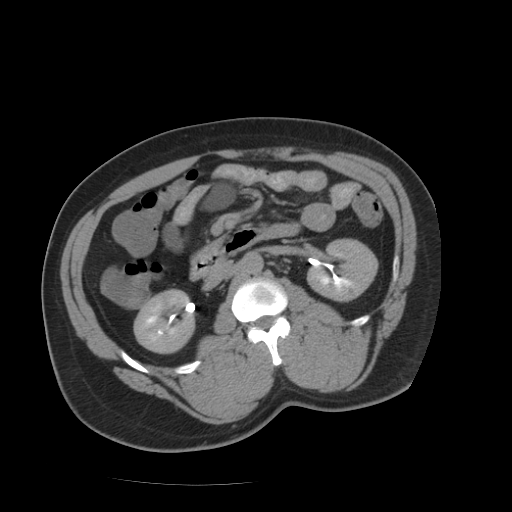
[im 135/180  soft-tissue]
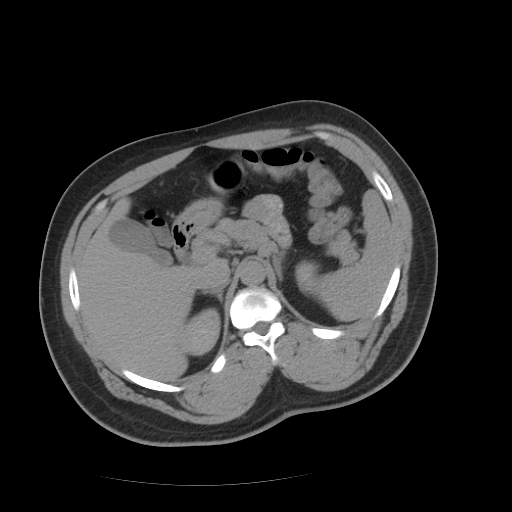
[im 157/180  soft-tissue]
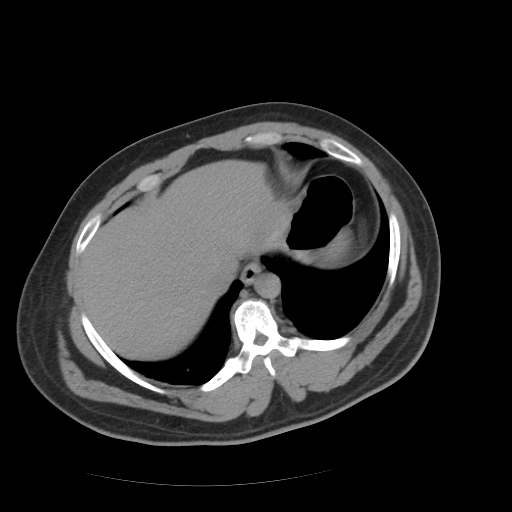

[14 of 32 positions shown; findings below may reference images not displayed]

FINDINGS: Dependent atelectasis is seen in the lung bases.  No
pleural or pericardial effusion.

The patient suffered an episode of vomiting during scanning
resulting in motion and some degradation of the study.  The
appendix is well visualized and normal in appearance.  There is no
fluid or lymphadenopathy.  The stomach and small and large bowel
appear normal.

The liver, spleen, gallbladder, adrenal glands and right kidney
appear normal. Small low attenuating lesion in the mid pole of the
left kidney is most consistent with a cyst.  There is a fluid
attenuation lesion in the mesenteric fat with Hounsfield unit
measurements of 0.  The lesion measures 3.8 cm cranial-caudal by
2.5 cm AP by 4.3 cm transverse. No lymphadenopathy is identified.
No focal bony abnormality is seen.
IMPRESSION: 1.  Negative for appendicitis or acute abnormality.
2.  Small cystic structure in the mesentery is noted and has benign
features. It likely represents a duplication or mesenteric
inclusion cyst.

## 2014-06-25 ENCOUNTER — Emergency Department (HOSPITAL_COMMUNITY)
Admission: EM | Admit: 2014-06-25 | Discharge: 2014-06-25 | Disposition: A | Discharge status: Home / Self Care | Payer: Self-pay | Attending: Emergency Medicine | Admitting: Emergency Medicine

## 2014-06-25 ENCOUNTER — Emergency Department (HOSPITAL_COMMUNITY): Payer: Self-pay

## 2014-06-25 ENCOUNTER — Encounter (HOSPITAL_COMMUNITY): Payer: Self-pay | Admitting: Emergency Medicine

## 2014-06-25 DIAGNOSIS — K0889 Other specified disorders of teeth and supporting structures: Secondary | ICD-10-CM

## 2014-06-25 DIAGNOSIS — J45909 Unspecified asthma, uncomplicated: Secondary | ICD-10-CM | POA: Insufficient documentation

## 2014-06-25 DIAGNOSIS — K089 Disorder of teeth and supporting structures, unspecified: Secondary | ICD-10-CM | POA: Insufficient documentation

## 2014-06-25 DIAGNOSIS — R1084 Generalized abdominal pain: Secondary | ICD-10-CM | POA: Insufficient documentation

## 2014-06-25 DIAGNOSIS — K5900 Constipation, unspecified: Secondary | ICD-10-CM | POA: Insufficient documentation

## 2014-06-25 DIAGNOSIS — K0381 Cracked tooth: Secondary | ICD-10-CM | POA: Insufficient documentation

## 2014-06-25 DIAGNOSIS — K219 Gastro-esophageal reflux disease without esophagitis: Secondary | ICD-10-CM | POA: Insufficient documentation

## 2014-06-25 DIAGNOSIS — I10 Essential (primary) hypertension: Secondary | ICD-10-CM | POA: Insufficient documentation

## 2014-06-25 LAB — CBC WITH DIFFERENTIAL/PLATELET
BASOS ABS: 0 10*3/uL (ref 0.0–0.1)
Basophils Relative: 0 % (ref 0–1)
Eosinophils Absolute: 0.1 10*3/uL (ref 0.0–0.7)
Eosinophils Relative: 2 % (ref 0–5)
HCT: 41.1 % (ref 39.0–52.0)
HEMOGLOBIN: 13.4 g/dL (ref 13.0–17.0)
Lymphocytes Relative: 25 % (ref 12–46)
Lymphs Abs: 1.9 10*3/uL (ref 0.7–4.0)
MCH: 30 pg (ref 26.0–34.0)
MCHC: 32.6 g/dL (ref 30.0–36.0)
MCV: 92.2 fL (ref 78.0–100.0)
MONOS PCT: 11 % (ref 3–12)
Monocytes Absolute: 0.9 10*3/uL (ref 0.1–1.0)
NEUTROS ABS: 4.7 10*3/uL (ref 1.7–7.7)
NEUTROS PCT: 62 % (ref 43–77)
PLATELETS: 284 10*3/uL (ref 150–400)
RBC: 4.46 MIL/uL (ref 4.22–5.81)
RDW: 12.4 % (ref 11.5–15.5)
WBC: 7.6 10*3/uL (ref 4.0–10.5)

## 2014-06-25 LAB — COMPREHENSIVE METABOLIC PANEL
ALBUMIN: 3.6 g/dL (ref 3.5–5.2)
ALK PHOS: 63 U/L (ref 39–117)
ALT: 17 U/L (ref 0–53)
AST: 18 U/L (ref 0–37)
Anion gap: 10 (ref 5–15)
BILIRUBIN TOTAL: 0.4 mg/dL (ref 0.3–1.2)
BUN: 11 mg/dL (ref 6–23)
CHLORIDE: 107 meq/L (ref 96–112)
CO2: 24 mEq/L (ref 19–32)
Calcium: 8.6 mg/dL (ref 8.4–10.5)
Creatinine, Ser: 0.98 mg/dL (ref 0.50–1.35)
GFR calc Af Amer: >90 mL/min (ref >90)
GFR calc non Af Amer: >90 mL/min (ref >90)
Glucose, Bld: 90 mg/dL (ref 70–99)
POTASSIUM: 4 meq/L (ref 3.7–5.3)
SODIUM: 141 meq/L (ref 137–147)
TOTAL PROTEIN: 6.9 g/dL (ref 6.0–8.3)

## 2014-06-25 LAB — LIPASE, BLOOD: LIPASE: 31 U/L (ref 11–59)

## 2014-06-25 MED ORDER — OXYCODONE-ACETAMINOPHEN 5-325 MG PO TABS
1.0000 | ORAL_TABLET | Freq: Once | ORAL | Status: AC
  Administered 2014-06-25: 1 via ORAL
  Filled 2014-06-25: qty 1

## 2014-06-25 MED ORDER — PANTOPRAZOLE SODIUM 40 MG IV SOLR
40.0000 mg | Freq: Once | INTRAVENOUS | Status: AC
  Administered 2014-06-25: 40 mg via INTRAVENOUS
  Filled 2014-06-25: qty 40

## 2014-06-25 MED ORDER — SODIUM CHLORIDE 0.9 % IV BOLUS (SEPSIS)
1000.0000 mL | Freq: Once | INTRAVENOUS | Status: AC
  Administered 2014-06-25: 1000 mL via INTRAVENOUS

## 2014-06-25 MED ORDER — BUPIVACAINE HCL (PF) 0.5 % IJ SOLN: 10.0000 mL | Freq: Once | INTRAMUSCULAR | Status: DC

## 2014-06-25 MED ORDER — POLYETHYLENE GLYCOL 3350 17 GM/SCOOP PO POWD: 1.0000 | Freq: Two times a day (BID) | ORAL | Status: DC

## 2014-06-25 MED ORDER — MORPHINE SULFATE 4 MG/ML IJ SOLN
4.0000 mg | Freq: Once | INTRAMUSCULAR | Status: AC
  Administered 2014-06-25: 4 mg via INTRAVENOUS
  Filled 2014-06-25: qty 1

## 2014-06-25 MED ORDER — PENICILLIN V POTASSIUM 500 MG PO TABS: 500.0000 mg | ORAL_TABLET | Freq: Four times a day (QID) | ORAL | Status: DC

## 2014-06-25 MED ORDER — TRAMADOL HCL 50 MG PO TABS: 50.0000 mg | ORAL_TABLET | Freq: Four times a day (QID) | ORAL | Status: DC | PRN

## 2014-06-25 MED ORDER — BUPIVACAINE-EPINEPHRINE (PF) 0.5% -1:200000 IJ SOLN
10.0000 mL | Freq: Once | INTRAMUSCULAR | Status: AC
  Administered 2014-06-25: 10 mL

## 2014-06-25 MED ORDER — ONDANSETRON 4 MG PO TBDP
8.0000 mg | ORAL_TABLET | Freq: Once | ORAL | Status: AC
  Administered 2014-06-25: 8 mg via ORAL
  Filled 2014-06-25: qty 2

## 2014-06-25 NOTE — ED Provider Notes (Signed)
CSN: [id number]     Arrival date & time 06/25/14  4098 History   First MD Initiated Contact with Patient 06/25/14 1125     Chief Complaint  Patient presents with  . Dental Pain  . Abdominal Pain     (Consider location/radiation/quality/duration/timing/severity/associated sxs/prior Treatment) HPI Comments: Patient is a 43 year old male with a past medical history of hypertension, GERD, and constipation who presents with abdominal pain for the past 3 days. The pain is located in generalized abdomen and does not radiate. The pain is described as cramping and severe. The pain started gradually and progressively worsened since the onset. No alleviating/aggravating factors. The patient has tried nothing for symptoms without relief. Associated symptoms include constipation. Patient reports no bowel movement in 2 weeks. Patient denies fever, headache, NVD, chest pain, SOB, dysuria.  Patient also complains of dental pain that started gradually 2 days ago. The dental pain is severe, constant and progressively worsening. The pain is aching and located in left upper jaw. The pain does not radiate. Eating makes the pain worse. Nothing makes the pain better. The patient has not tried anything for pain. No associated symptoms.   Past Medical History  Diagnosis Date  . Hemorrhoid   . Hypertension   . Constipation   . Asthma   . Bronchitis   . GERD (gastroesophageal reflux disease)    History reviewed. No pertinent past surgical history. Family History  Problem Relation Age of Onset  . Stroke Mother   . Hypertension Father   . Diabetes Father   . Breast cancer Sister   . Alcohol abuse Neg Hx   . Heart disease Neg Hx   . Hyperlipidemia Neg Hx   . Kidney disease Neg Hx    History  Substance Use Topics  . Smoking status: Never Smoker   . Smokeless tobacco: Never Used  . Alcohol Use: Yes    Review of Systems  Constitutional: Negative for fever, chills and fatigue.  HENT: Positive for dental  problem. Negative for trouble swallowing.   Eyes: Negative for visual disturbance.  Respiratory: Negative for shortness of breath.   Cardiovascular: Negative for chest pain and palpitations.  Gastrointestinal: Positive for abdominal pain and constipation. Negative for nausea, vomiting and diarrhea.  Genitourinary: Negative for dysuria and difficulty urinating.  Musculoskeletal: Negative for arthralgias and neck pain.  Skin: Negative for color change.  Neurological: Negative for dizziness and weakness.  Psychiatric/Behavioral: Negative for dysphoric mood.      Allergies  Shellfish allergy  Home Medications   Prior to Admission medications   Medication Sig Start Date End Date Taking? Authorizing Provider  hyoscyamine (LEVSIN/SL) 0.125 MG SL tablet Place 1 tablet (0.125 mg total) under the tongue every 4 (four) hours as needed. 11/25/13   Richardean Canal, MD  metoCLOPramide (REGLAN) 10 MG tablet Take 1 tablet (10 mg total) by mouth every 6 (six) hours as needed for nausea (nausea/headache). 11/25/13   Richardean Canal, MD   BP 115/76  Pulse 57  Temp(Src) 98.4 F (36.9 C) (Oral)  Resp 12  Ht 5\' 9"  (1.753 m)  Wt 300 lb (136.079 kg)  BMI 44.28 kg/m2  SpO2 97% Physical Exam  Nursing note and vitals reviewed. Constitutional: He is oriented to person, place, and time. He appears well-developed and well-nourished. No distress.  HENT:  Head: Normocephalic and atraumatic.  Mouth/Throat: Oropharynx is clear and moist. No oropharyngeal exudate.  Poor dentition. Cracked, left upper posterior molar that is tender to percussion.  Eyes: Conjunctivae and EOM are normal.  Neck: Normal range of motion.  Cardiovascular: Normal rate and regular rhythm.  Exam reveals no gallop and no friction rub.   No murmur heard. Pulmonary/Chest: Effort normal and breath sounds normal. He has no wheezes. He has no rales. He exhibits no tenderness.  Abdominal: Soft. He exhibits no distension. There is tenderness. There is  no rebound and no guarding.  Generalized tenderness to palpation. No focal tenderness to palpation or peritoneal signs.   Musculoskeletal: Normal range of motion.  Neurological: He is alert and oriented to person, place, and time.  Speech is goal-oriented. Moves limbs without ataxia.   Skin: Skin is warm and dry.  Psychiatric: He has a normal mood and affect. His behavior is normal.    ED Course  NERVE BLOCK Date/Time: 06/25/2014 2:13 PM Performed by: Emilia BeckSZEKALSKI, Audray Rumore Authorized by: Emilia BeckSZEKALSKI, Irie Dowson Consent: Verbal consent obtained. Consent given by: patient Patient understanding: patient states understanding of the procedure being performed Patient consent: the patient's understanding of the procedure matches consent given Patient identity confirmed: verbally with patient Indications: pain relief Body area: face/mouth Nerve: anterior superior alveolar Laterality: left Patient sedated: no Preparation: Patient was prepped and draped in the usual sterile fashion. Patient position: supine Needle gauge: 27 G Location technique: anatomical landmarks Local anesthetic: bupivacaine 0.25% without epinephrine Anesthetic total: 3 ml Outcome: pain improved Patient tolerance: Patient tolerated the procedure well with no immediate complications.   (including critical care time) Labs Review Labs Reviewed  CBC WITH DIFFERENTIAL  COMPREHENSIVE METABOLIC PANEL  LIPASE, BLOOD  URINALYSIS, ROUTINE W REFLEX MICROSCOPIC    Imaging Review Dg Abd Acute W/chest  06/25/2014   CLINICAL DATA:  Abdominal cramping, intermittent constipation.  EXAM: ACUTE ABDOMEN SERIES (ABDOMEN 2 VIEW & CHEST 1 VIEW)  COMPARISON:  CT of the abdomen pelvis Mar 31, 2013  FINDINGS: Cardiomediastinal silhouette is unremarkable. Lungs are clear, no pleural effusions. No pneumothorax. Soft tissue planes and included osseous structures are unremarkable.  Bowel gas pattern is nondilated and nonobstructive. Mild amount of  retained large bowel stool. No intra-abdominal mass effect, pathologic calcifications or free air. Soft tissue planes and included osseous structures are nonsuspicious.  IMPRESSION: No acute cardiopulmonary process.  Mild amount of retained large bowel stool without obstructive bowel gas pattern.   Electronically Signed   By: Awilda Metroourtnay  Bloomer   On: 06/25/2014 13:13     EKG Interpretation None      MDM   Final diagnoses:  Constipation, unspecified constipation type  Pain, dental    11:44 AM Labs and urinalysis pending. Vitals stable and patient afebrile.   2:09 PM Labs and urinalysis unremarkable for acute changes. Patient reports relief of his abdominal pain after morphine and protonix. Acute abdominal series shows stool burden without signs of obstruction. Patient given dental block which improved his pain. Patient educated on drinking plenty of water and eating fruits and vegetables for adequate fiber and bowel movements.     Emilia BeckKaitlyn Georgie Eduardo, PA-C 06/25/14 1432

## 2014-06-25 NOTE — ED Notes (Signed)
Pt c/o left upper toothache and abdominal pain x 2-3 days. Pt has history of gastritis.

## 2014-06-25 NOTE — Discharge Instructions (Signed)
Take Miralax as directed for constipation. Take Veetid as directed until gone. Take Tramadol as needed for pain. Drink plenty of water and eat fruits and vegetables for regular bowel movements. Follow up with your doctor and the recommended dentist for further evaluation. Refer to attached documents for more information.

## 2014-06-26 NOTE — ED Provider Notes (Signed)
Medical screening examination/treatment/procedure(s) were performed by non-physician practitioner and as supervising physician I was immediately available for consultation/collaboration.   EKG Interpretation None        Candyce ChurnJohn David Tashala Cumbo III, MD 06/26/14 2150

## 2015-10-29 ENCOUNTER — Encounter (HOSPITAL_COMMUNITY): Payer: Self-pay | Admitting: Emergency Medicine

## 2015-10-29 ENCOUNTER — Emergency Department (HOSPITAL_COMMUNITY)
Admission: EM | Admit: 2015-10-29 | Discharge: 2015-10-29 | Disposition: A | Discharge status: Home / Self Care | Payer: Self-pay | Attending: Emergency Medicine | Admitting: Emergency Medicine

## 2015-10-29 DIAGNOSIS — K219 Gastro-esophageal reflux disease without esophagitis: Secondary | ICD-10-CM | POA: Insufficient documentation

## 2015-10-29 DIAGNOSIS — Z79899 Other long term (current) drug therapy: Secondary | ICD-10-CM | POA: Insufficient documentation

## 2015-10-29 DIAGNOSIS — I10 Essential (primary) hypertension: Secondary | ICD-10-CM | POA: Insufficient documentation

## 2015-10-29 DIAGNOSIS — J45909 Unspecified asthma, uncomplicated: Secondary | ICD-10-CM | POA: Insufficient documentation

## 2015-10-29 DIAGNOSIS — M545 Low back pain, unspecified: Secondary | ICD-10-CM

## 2015-10-29 DIAGNOSIS — K5900 Constipation, unspecified: Secondary | ICD-10-CM | POA: Insufficient documentation

## 2015-10-29 DIAGNOSIS — Z792 Long term (current) use of antibiotics: Secondary | ICD-10-CM | POA: Insufficient documentation

## 2015-10-29 MED ORDER — KETOROLAC TROMETHAMINE 60 MG/2ML IM SOLN
60.0000 mg | Freq: Once | INTRAMUSCULAR | Status: AC
  Administered 2015-10-29: 60 mg via INTRAMUSCULAR
  Filled 2015-10-29: qty 2

## 2015-10-29 MED ORDER — METHOCARBAMOL 500 MG PO TABS: 500.0000 mg | ORAL_TABLET | Freq: Two times a day (BID) | ORAL | Status: DC

## 2015-10-29 MED ORDER — NAPROXEN 500 MG PO TABS: 500.0000 mg | ORAL_TABLET | Freq: Two times a day (BID) | ORAL | Status: DC

## 2015-10-29 NOTE — ED Notes (Signed)
APP at bedside 

## 2015-10-29 NOTE — ED Notes (Signed)
Pt sts lower back pain x 2 days worse with movement

## 2015-10-29 NOTE — ED Provider Notes (Signed)
CSN: [id number]   Arrival date & time 10/29/15 1251  History  By signing my name below, I, Bethel Born, attest that this documentation has been prepared under the direction and in the presence of Rolm Gala Perlie Stene PA-C Electronically Signed: Bethel Born, ED Scribe. 10/29/2015. 3:25 PM. Chief Complaint  Patient presents with  . Back Pain    HPI The history is provided by the patient. No language interpreter was used.   Thomas Holloway is a 44 y.o. male who presents to the Emergency Department complaining of recurrent, atraumatic, sharp, constant, 10/10 in severity, lower back pain with onset this morning upon waking. He denies injury or inciting event that caused the back pain. The pain is exacerbated by bending at the waist and walking. Last night an OTC pain medication provided insufficient relief in pain at home. He states he had a similar back pain episode 2 years ago that required muscle relaxers and pain medicine. Pt denies pain radiation to the legs, LE numbness or weakness, abdominal pain, chest pain, SOB, nausea, vomiting, difficulty urinating, and rash at the lower back. He has not been taking his antihypertensive because he lost them when he moved.  Past Medical History  Diagnosis Date  . Hemorrhoid   . Hypertension   . Constipation   . Asthma   . Bronchitis   . GERD (gastroesophageal reflux disease)     History reviewed. No pertinent past surgical history.  Family History  Problem Relation Age of Onset  . Stroke Mother   . Hypertension Father   . Diabetes Father   . Breast cancer Sister   . Alcohol abuse Neg Hx   . Heart disease Neg Hx   . Hyperlipidemia Neg Hx   . Kidney disease Neg Hx     Social History  Substance Use Topics  . Smoking status: Never Smoker   . Smokeless tobacco: Never Used  . Alcohol Use: Yes     Review of Systems  Respiratory: Negative for shortness of breath.   Cardiovascular: Negative for chest pain.  Gastrointestinal: Negative for  nausea, vomiting and abdominal pain.  Musculoskeletal: Positive for back pain.  Skin: Negative for rash.  Neurological: Negative for weakness and numbness.  All other systems reviewed and are negative.  Home Medications   Prior to Admission medications   Medication Sig Start Date End Date Taking? Authorizing Provider  albuterol (PROVENTIL HFA;VENTOLIN HFA) 108 (90 BASE) MCG/ACT inhaler Inhale 2 puffs into the lungs every 6 (six) hours as needed for wheezing or shortness of breath.    Historical Provider, MD  clotrimazole (LOTRIMIN) 1 % cream Apply 1 application topically 3 (three) times daily.    Historical Provider, MD  hyoscyamine (LEVSIN SL) 0.125 MG SL tablet Place 0.125 mg under the tongue every 4 (four) hours as needed for cramping.    Historical Provider, MD  methocarbamol (ROBAXIN) 500 MG tablet Take 1 tablet (500 mg total) by mouth 2 (two) times daily. 10/29/15   August Longest, PA-C  metoCLOPramide (REGLAN) 10 MG tablet Take 1 tablet (10 mg total) by mouth every 6 (six) hours as needed for nausea (nausea/headache). 11/25/13   Richardean Canal, MD  naproxen (NAPROSYN) 500 MG tablet Take 1 tablet (500 mg total) by mouth 2 (two) times daily. 10/29/15   Laurren Lepkowski, PA-C  penicillin v potassium (VEETID) 500 MG tablet Take 1 tablet (500 mg total) by mouth 4 (four) times daily. 06/25/14   Kaitlyn Szekalski, PA-C  polyethylene glycol powder (GLYCOLAX/MIRALAX) powder Take 255  g (1 Container total) by mouth 2 (two) times daily. Until daily soft stools  OTC 06/25/14   Emilia BeckKaitlyn Szekalski, PA-C  traMADol (ULTRAM) 50 MG tablet Take 1 tablet (50 mg total) by mouth every 6 (six) hours as needed. 06/25/14   Emilia BeckKaitlyn Szekalski, PA-C    Allergies  Shellfish allergy  Triage Vitals: BP 184/110 mmHg  Pulse 89  Temp(Src) 98.3 F (36.8 C) (Oral)  Resp 18  SpO2 96%  Physical Exam  Constitutional: He appears well-developed and well-nourished. He appears distressed.  Appears in moderate pain  HENT:  Head:  Normocephalic and atraumatic.  Right Ear: External ear normal.  Left Ear: External ear normal.  Eyes: Conjunctivae are normal. Right eye exhibits no discharge. Left eye exhibits no discharge. No scleral icterus.  Neck: Normal range of motion.  Cardiovascular: Normal rate.   Pulmonary/Chest: Effort normal.  Musculoskeletal: Normal range of motion.       Lumbar back: He exhibits tenderness. He exhibits normal range of motion, no deformity and no spasm.       Back:  Generalized tenderness over the lumbar region without focal tenderness over lumbar or thoracic spinous processes. No bony deformities or stepoffs. FROM of lumbar spine intact though with moderate pain. Negative straight leg raise test. Pt is able to ambulate with a steady gait though with some discomfort  Neurological: He is alert. He exhibits normal muscle tone. Coordination normal.  5/5 strength in the bilateral lower extremities. Sensation to light touch intact throughout.  Skin: Skin is warm and dry. No rash noted.  No rash over lumbar region  Psychiatric: He has a normal mood and affect. His behavior is normal.  Nursing note and vitals reviewed.   ED Course  Procedures  DIAGNOSTIC STUDIES: Oxygen Saturation is 96% on RA,  normal by my interpretation.    COORDINATION OF CARE: 3:22 PM Discussed treatment plan which includes pain management with pt at bedside and pt agreed to the plan.  Labs Review- Labs Reviewed - No data to display  Imaging Review No results found.  MDM   Final diagnoses:  Bilateral low back pain without sciatica   Patient presenting with back pain x 1 day. Afebrile. Non-focal neuro exam. Patient is able to ambulate though with some discomfort. No loss of bowel or bladder control. No numbness or weakness in the lower extremities. No concern for cauda equina. No history of IVDU or cancer. Conservative therapy including back exercises, heat, ice, tylenol or ibuprofen discussed. Will give muscle relaxer  for back spasms. Discussed side effects of muscle relaxer and avoiding driving. pt also noted to be hypertensive which is likely due to his noncompliance with his medications. Encouraged pt to follow up with his PCP to discuss restarting his HTN medications and for a back pain follow up. Return precautions discussed and given in discharge paperwork. Pt is stable for discharge.   I personally performed the services described in this documentation, which was scribed in my presence. The recorded information has been reviewed and is accurate.      Alveta HeimlichStevi Berthel Bagnall, PA-C 10/29/15 1547  Zadie Rhineonald Wickline, MD 10/30/15 [phone]

## 2015-10-29 NOTE — Discharge Instructions (Signed)
Back Pain, Adult °Back pain is very common in adults. The cause of back pain is rarely dangerous and the pain often gets better over time. The cause of your back pain may not be known. Some common causes of back pain include: °1. Strain of the muscles or ligaments supporting the spine. °2. Wear and tear (degeneration) of the spinal disks. °3. Arthritis. °4. Direct injury to the back. °For many people, back pain may return. Since back pain is rarely dangerous, most people can learn to manage this condition on their own. °HOME CARE INSTRUCTIONS °Watch your back pain for any changes. The following actions may help to lessen any discomfort you are feeling: °1. Remain active. It is stressful on your back to sit or stand in one place for long periods of time. Do not sit, drive, or stand in one place for more than 30 minutes at a time. Take short walks on even surfaces as soon as you are able. Try to increase the length of time you walk each day. °2. Exercise regularly as directed by your health care provider. Exercise helps your back heal faster. It also helps avoid future injury by keeping your muscles strong and flexible. °3. Do not stay in bed. Resting more than 1-2 days can delay your recovery. °4. Pay attention to your body when you bend and lift. The most comfortable positions are those that put less stress on your recovering back. Always use proper lifting techniques, including: °1. Bending your knees. °2. Keeping the load close to your body. °3. Avoiding twisting. °5. Find a comfortable position to sleep. Use a firm mattress and lie on your side with your knees slightly bent. If you lie on your back, put a pillow under your knees. °6. Avoid feeling anxious or stressed. Stress increases muscle tension and can worsen back pain. It is important to recognize when you are anxious or stressed and learn ways to manage it, such as with exercise. °7. Take medicines only as directed by your health care provider.  Over-the-counter medicines to reduce pain and inflammation are often the most helpful. Your health care provider may prescribe muscle relaxant drugs. These medicines help dull your pain so you can more quickly return to your normal activities and healthy exercise. °8. Apply ice to the injured area: °1. Put ice in a plastic bag. °2. Place a towel between your skin and the bag. °3. Leave the ice on for 20 minutes, 2-3 times a day for the first 2-3 days. After that, ice and heat may be alternated to reduce pain and spasms. °9. Maintain a healthy weight. Excess weight puts extra stress on your back and makes it difficult to maintain good posture. °SEEK MEDICAL CARE IF: °1. You have pain that is not relieved with rest or medicine. °2. You have increasing pain going down into the legs or buttocks. °3. You have pain that does not improve in one week. °4. You have night pain. °5. You lose weight. °6. You have a fever or chills. °SEEK IMMEDIATE MEDICAL CARE IF:  °1. You develop new bowel or bladder control problems. °2. You have unusual weakness or numbness in your arms or legs. °3. You develop nausea or vomiting. °4. You develop abdominal pain. °5. You feel faint. °  °This information is not intended to replace advice given to you by your health care provider. Make sure you discuss any questions you have with your health care provider. °  °Document Released: 11/08/2005 Document Revised: 11/29/2014 Document Reviewed: 03/12/2014 °Elsevier Interactive Patient Education ©2016 [employer] ° °Back Exercises °The following exercises strengthen the muscles that help to support the back. They also help to keep the lower back flexible. Doing these exercises can help to prevent back pain or lessen existing pain. °If you have back pain or discomfort, try doing these exercises 2-3 times each day or as told by your health care provider. When the pain goes away, do them once each day, but increase the number of times that you repeat the  steps for each exercise (do more repetitions). If you do not have back pain or discomfort, do these exercises once each day or as told by your health care provider. °EXERCISES °Single Knee to Chest °Repeat these steps 3-5 times for each leg: °5. Lie on your back on a firm bed or the floor with your legs extended. °6. Bring one knee to your chest. Your other leg should stay extended and in contact with the floor. °7. Hold your knee in place by grabbing your knee or thigh. °8. Pull on your knee until you feel a gentle stretch in your lower back. °9. Hold the stretch for 10-30 seconds. °10. Slowly release and straighten your leg. °Pelvic Tilt °Repeat these steps 5-10 times: °10. Lie on your back on a firm bed or the floor with your legs extended. °11. Bend your knees so they are pointing toward the ceiling and your feet are flat on the floor. °12. Tighten your lower abdominal muscles to press your lower back against the floor. This motion will tilt your pelvis so your tailbone points up toward the ceiling instead of pointing to your feet or the floor. °13. With gentle tension and even breathing, hold this position for 5-10 seconds. °Cat-Cow °Repeat these steps until your lower back becomes more flexible: °7. Get into a hands-and-knees position on a firm surface. Keep your hands under your shoulders, and keep your knees under your hips. You may place padding under your knees for comfort. °8. Let your head hang down, and point your tailbone toward the floor so your lower back becomes rounded like the back of a cat. °9. Hold this position for 5 seconds. °10. Slowly lift your head and point your tailbone up toward the ceiling so your back forms a sagging arch like the back of a cow. °11. Hold this position for 5 seconds. °Press-Ups °Repeat these steps 5-10 times: °6. Lie on your abdomen (face-down) on the floor. °7. Place your palms near your head, about shoulder-width apart. °8. While you keep your back as relaxed as  possible and keep your hips on the floor, slowly straighten your arms to raise the top half of your body and lift your shoulders. Do not use your back muscles to raise your upper torso. You may adjust the placement of your hands to make yourself more comfortable. °9. Hold this position for 5 seconds while you keep your back relaxed. °10. Slowly return to lying flat on the floor. °Bridges °Repeat these steps 10 times: °1. Lie on your back on a firm surface. °2. Bend your knees so they are pointing toward the ceiling and your feet are flat on the floor. °3. Tighten your buttocks muscles and lift your buttocks off of the floor until your waist is at almost the same height as your knees. You should feel the muscles working in your buttocks and the back of your thighs. If you do not feel these muscles, slide your feet 1-2 inches farther away from your buttocks. °4. Hold this   position for 3-5 seconds. °5. Slowly lower your hips to the starting position, and allow your buttocks muscles to relax completely. °If this exercise is too easy, try doing it with your arms crossed over your chest. °Abdominal Crunches °Repeat these steps 5-10 times: °1. Lie on your back on a firm bed or the floor with your legs extended. °2. Bend your knees so they are pointing toward the ceiling and your feet are flat on the floor. °3. Cross your arms over your chest. °4. Tip your chin slightly toward your chest without bending your neck. °5. Tighten your abdominal muscles and slowly raise your trunk (torso) high enough to lift your shoulder blades a tiny bit off of the floor. Avoid raising your torso higher than that, because it can put too much stress on your low back and it does not help to strengthen your abdominal muscles. °6. Slowly return to your starting position. °Back Lifts °Repeat these steps 5-10 times: °1. Lie on your abdomen (face-down) with your arms at your sides, and rest your forehead on the floor. °2. Tighten the muscles in your  legs and your buttocks. °3. Slowly lift your chest off of the floor while you keep your hips pressed to the floor. Keep the back of your head in line with the curve in your back. Your eyes should be looking at the floor. °4. Hold this position for 3-5 seconds. °5. Slowly return to your starting position. °SEEK MEDICAL CARE IF: °· Your back pain or discomfort gets much worse when you do an exercise. °· Your back pain or discomfort does not lessen within 2 hours after you exercise. °If you have any of these problems, stop doing these exercises right away. Do not do them again unless your health care provider says that you can. °SEEK IMMEDIATE MEDICAL CARE IF: °· You develop sudden, severe back pain. If this happens, stop doing the exercises right away. Do not do them again unless your health care provider says that you can. °  °This information is not intended to replace advice given to you by your health care provider. Make sure you discuss any questions you have with your health care provider. °  °Document Released: 12/16/2004 Document Revised: 07/30/2015 Document Reviewed: 01/02/2015 °Elsevier Interactive Patient Education ©2016 [employer] Inc[employer] ° °

## 2015-10-29 NOTE — ED Notes (Signed)
Pt able to ambulate independently.  Pt asking for resources for medication assistance.  Burna MortimerWanda with Social Work will be given patient's phone number to contact.

## 2016-11-11 ENCOUNTER — Encounter (HOSPITAL_COMMUNITY): Payer: Self-pay

## 2016-11-11 ENCOUNTER — Emergency Department (HOSPITAL_COMMUNITY)
Admission: EM | Admit: 2016-11-11 | Discharge: 2016-11-11 | Disposition: A | Discharge status: Home / Self Care | Payer: Medicare Other | Attending: Emergency Medicine | Admitting: Emergency Medicine

## 2016-11-11 DIAGNOSIS — M79672 Pain in left foot: Secondary | ICD-10-CM | POA: Diagnosis not present

## 2016-11-11 DIAGNOSIS — J45909 Unspecified asthma, uncomplicated: Secondary | ICD-10-CM | POA: Insufficient documentation

## 2016-11-11 DIAGNOSIS — G8918 Other acute postprocedural pain: Secondary | ICD-10-CM | POA: Insufficient documentation

## 2016-11-11 DIAGNOSIS — I10 Essential (primary) hypertension: Secondary | ICD-10-CM | POA: Diagnosis not present

## 2016-11-11 HISTORY — DX: Other fracture of unspecified lower leg, initial encounter for closed fracture: S82.899A

## 2016-11-11 MED ORDER — OXYCODONE-ACETAMINOPHEN 10-325 MG PO TABS: 1.0000 | ORAL_TABLET | ORAL | 0 refills | Status: DC | PRN

## 2016-11-11 MED ORDER — HYDROMORPHONE HCL 2 MG/ML IJ SOLN
1.0000 mg | Freq: Once | INTRAMUSCULAR | Status: AC
  Administered 2016-11-11: 1 mg via INTRAMUSCULAR
  Filled 2016-11-11: qty 1

## 2016-11-11 NOTE — Discharge Instructions (Addendum)
Follow up with your surgeon as scheduled.  Do NOT bear weight on your left foot until given the OK by your surgeon.  Elevate the left foot as much as possible.  If you to change back to wearing the black boot, remove it at least twice per day and elevate your foot to help with pain and swelling

## 2016-11-11 NOTE — ED Provider Notes (Signed)
[facility]-EMERGENCY DEPT Provider Note   CSN: [id number] Arrival date & time: 11/11/16  1922   Duplicate note--Omit  History   Chief Complaint Chief Complaint  Patient presents with  . Foot Pain    HPI Thomas Holloway is a 45 y.o. male.  HPI  Past Medical History:  Diagnosis Date  . Ankle fracture   . Asthma   . Bronchitis   . Constipation   . GERD (gastroesophageal reflux disease)   . Hemorrhoid   . Hypertension     Patient Active Problem List   Diagnosis Date Noted  . Routine general medical examination at a health care facility 07/05/2012  . Hyperhidrosis 07/05/2012  . Blood in stool 07/05/2012  . Need for Tdap vaccination 07/05/2012  . Tinea cruris 07/05/2012    History reviewed. No pertinent surgical history.     Home Medications    Prior to Admission medications   Medication Sig Start Date End Date Taking? Authorizing Provider  albuterol (PROVENTIL HFA;VENTOLIN HFA) 108 (90 BASE) MCG/ACT inhaler Inhale 2 puffs into the lungs every 6 (six) hours as needed for wheezing or shortness of breath.    Historical Provider, MD  clotrimazole (LOTRIMIN) 1 % cream Apply 1 application topically 3 (three) times daily.    Historical Provider, MD  hyoscyamine (LEVSIN SL) 0.125 MG SL tablet Place 0.125 mg under the tongue every 4 (four) hours as needed for cramping.    Historical Provider, MD  methocarbamol (ROBAXIN) 500 MG tablet Take 1 tablet (500 mg total) by mouth 2 (two) times daily. 10/29/15   Stevi Barrett, PA-C  metoCLOPramide (REGLAN) 10 MG tablet Take 1 tablet (10 mg total) by mouth every 6 (six) hours as needed for nausea (nausea/headache). 11/25/13   Charlynne Panderavid Hsienta Yao, MD  naproxen (NAPROSYN) 500 MG tablet Take 1 tablet (500 mg total) by mouth 2 (two) times daily. 10/29/15   Stevi Barrett, PA-C  penicillin v potassium (VEETID) 500 MG tablet Take 1 tablet (500 mg total) by mouth 4 (four) times daily. 06/25/14   Kaitlyn Szekalski, PA-C  polyethylene glycol powder  (GLYCOLAX/MIRALAX) powder Take 255 g (1 Container total) by mouth 2 (two) times daily. Until daily soft stools  OTC 06/25/14   Emilia BeckKaitlyn Szekalski, PA-C  traMADol (ULTRAM) 50 MG tablet Take 1 tablet (50 mg total) by mouth every 6 (six) hours as needed. 06/25/14   Emilia BeckKaitlyn Szekalski, PA-C    Family History Family History  Problem Relation Age of Onset  . Stroke Mother   . Hypertension Father   . Diabetes Father   . Breast cancer Sister   . Alcohol abuse Neg Hx   . Heart disease Neg Hx   . Hyperlipidemia Neg Hx   . Kidney disease Neg Hx     Social History Social History  Substance Use Topics  . Smoking status: Never Smoker  . Smokeless tobacco: Never Used  . Alcohol use Yes     Allergies   Shellfish allergy   Review of Systems Review of Systems   Physical Exam Updated Vital Signs BP 140/83 (BP Location: Right Arm)   Pulse 110   Temp 98.7 F (37.1 C) (Oral)   Resp 22   SpO2 100%   Physical Exam   ED Treatments / Results  Labs (all labs ordered are listed, but only abnormal results are displayed) Labs Reviewed - No data to display  EKG  EKG Interpretation None       Radiology No results found.  Procedures Procedures (including critical care  time)  Medications Ordered in ED Medications - No data to display   Initial Impression / Assessment and Plan / ED Course  I have reviewed the triage vital signs and the nursing notes.  Pertinent labs & imaging results that were available during my care of the patient were reviewed by me and considered in my medical decision making (see chart for details).  Clinical Course      Final Clinical Impressions(s) / ED Diagnoses   Final diagnoses:  None    New Prescriptions New Prescriptions   No medications on file     Rolland PorterMark Konner Warrior, MD 11/17/16 0700

## 2016-11-11 NOTE — ED Notes (Signed)
Surgical boot removed from LEFT foot by Dr. Fayrene FearingJames. Pt surgical incision appears to be healing well. No redness or drainage present. Moderate amount of swelling to LLE. PMS intact. Pedal pulse 2+. Skin warm and dry

## 2016-11-11 NOTE — ED Provider Notes (Signed)
[facility]-EMERGENCY DEPT Provider Note   CSN: [id number] Arrival date & time: 11/11/16  1922   By signing my name below, I, Teofilo PodMatthew P. Jamison, attest that this documentation has been prepared under the direction and in the presence of Rolland PorterMark Charlii Yost, MD . Electronically Signed: Teofilo PodMatthew P. Jamison, ED Scribe. 11/11/2016. 7:54 PM.   History   Chief Complaint Chief Complaint  Patient presents with  . Foot Pain   The history is provided by the patient. No language interpreter was used.   HPI Comments:  Thomas Holloway is a 45 y.o. male who presents to the Emergency Department via EMS complaining of worsening left foot pain following surgery 8 days ago. Pt fell and injured his foot and fractured it 9 days ago and then had surgery the following day. Pt states that the pain has been worsening. Pt is wearing a walking boot and using crutches and has fallen several times while using them. Pt states that the pain is non-radiating. Pt reports that he had a fever and that his body temp has been fluctuating today. Pt is homeless and has been staying at a hotel and [place],but has nowhere to stay tonight. No alleviating factors noted. Pt denies other associated symptoms.   Past Medical History:  Diagnosis Date  . Ankle fracture   . Asthma   . Bronchitis   . Constipation   . GERD (gastroesophageal reflux disease)   . Hemorrhoid   . Hypertension     Patient Active Problem List   Diagnosis Date Noted  . Routine general medical examination at a health care facility 07/05/2012  . Hyperhidrosis 07/05/2012  . Blood in stool 07/05/2012  . Need for Tdap vaccination 07/05/2012  . Tinea cruris 07/05/2012    History reviewed. No pertinent surgical history.     Home Medications    Prior to Admission medications   Medication Sig Start Date End Date Taking? Authorizing Provider  albuterol (PROVENTIL HFA;VENTOLIN HFA) 108 (90 BASE) MCG/ACT inhaler Inhale 2 puffs into the lungs every 6 (six) hours as needed  for wheezing or shortness of breath.    Historical Provider, MD  clotrimazole (LOTRIMIN) 1 % cream Apply 1 application topically 3 (three) times daily.    Historical Provider, MD  hyoscyamine (LEVSIN SL) 0.125 MG SL tablet Place 0.125 mg under the tongue every 4 (four) hours as needed for cramping.    Historical Provider, MD  methocarbamol (ROBAXIN) 500 MG tablet Take 1 tablet (500 mg total) by mouth 2 (two) times daily. 10/29/15   Stevi Barrett, PA-C  metoCLOPramide (REGLAN) 10 MG tablet Take 1 tablet (10 mg total) by mouth every 6 (six) hours as needed for nausea (nausea/headache). 11/25/13   Charlynne Panderavid Hsienta Yao, MD  naproxen (NAPROSYN) 500 MG tablet Take 1 tablet (500 mg total) by mouth 2 (two) times daily. 10/29/15   Stevi Barrett, PA-C  oxyCODONE-acetaminophen (PERCOCET) 10-325 MG tablet Take 1 tablet by mouth every 4 (four) hours as needed for pain. 11/11/16   Rolland PorterMark Jakevious Hollister, MD  penicillin v potassium (VEETID) 500 MG tablet Take 1 tablet (500 mg total) by mouth 4 (four) times daily. 06/25/14   Kaitlyn Szekalski, PA-C  polyethylene glycol powder (GLYCOLAX/MIRALAX) powder Take 255 g (1 Container total) by mouth 2 (two) times daily. Until daily soft stools  OTC 06/25/14   Emilia BeckKaitlyn Szekalski, PA-C  traMADol (ULTRAM) 50 MG tablet Take 1 tablet (50 mg total) by mouth every 6 (six) hours as needed. 06/25/14   Emilia BeckKaitlyn Szekalski, PA-C  Family History Family History  Problem Relation Age of Onset  . Stroke Mother   . Hypertension Father   . Diabetes Father   . Breast cancer Sister   . Alcohol abuse Neg Hx   . Heart disease Neg Hx   . Hyperlipidemia Neg Hx   . Kidney disease Neg Hx     Social History Social History  Substance Use Topics  . Smoking status: Never Smoker  . Smokeless tobacco: Never Used  . Alcohol use Yes     Allergies   Shellfish allergy   Review of Systems Review of Systems  Constitutional: Negative for appetite change, chills, diaphoresis, fatigue and fever.  HENT: Negative  for mouth sores, sore throat and trouble swallowing.   Eyes: Negative for visual disturbance.  Respiratory: Negative for cough, chest tightness, shortness of breath and wheezing.   Cardiovascular: Negative for chest pain.  Gastrointestinal: Negative for abdominal distention, abdominal pain, diarrhea, nausea and vomiting.  Endocrine: Negative for polydipsia, polyphagia and polyuria.  Genitourinary: Negative for dysuria, frequency and hematuria.  Musculoskeletal: Positive for arthralgias and gait problem.  Skin: Negative for color change, pallor and rash.  Neurological: Negative for dizziness, syncope, light-headedness and headaches.  Hematological: Does not bruise/bleed easily.  Psychiatric/Behavioral: Negative for behavioral problems and confusion.     Physical Exam Updated Vital Signs BP 140/83 (BP Location: Right Arm)   Pulse 110   Temp 98.7 F (37.1 C) (Oral)   Resp 22   SpO2 100%   Physical Exam  Constitutional: He is oriented to person, place, and time. He appears well-developed and well-nourished. No distress.  Crying and distraught.  HENT:  Head: Normocephalic.  Eyes: Conjunctivae are normal. Pupils are equal, round, and reactive to light. No scleral icterus.  Neck: Normal range of motion. Neck supple. No thyromegaly present.  Cardiovascular: Normal rate and regular rhythm.  Exam reveals no gallop and no friction rub.   No murmur heard. Pulmonary/Chest: Effort normal and breath sounds normal. No respiratory distress. He has no wheezes. He has no rales.  Abdominal: Soft. Bowel sounds are normal. He exhibits no distension. There is no tenderness. There is no rebound.  Musculoskeletal: Normal range of motion.  DonJoy boot removed. Expected soft tissue swelling noted. Incision intact. Not painful. Proximal musculature.   Neurological: He is alert and oriented to person, place, and time.  Sensation intact.   Skin: Skin is warm and dry. Capillary refill takes less than 2  seconds. No rash noted.  Psychiatric: He has a normal mood and affect. His behavior is normal.     ED Treatments / Results  DIAGNOSTIC STUDIES:  Oxygen Saturation is 100% on RA, normal by my interpretation.    COORDINATION OF CARE:  7:54 PM Discussed treatment plan with pt at bedside and pt agreed to plan.   Labs (all labs ordered are listed, but only abnormal results are displayed) Labs Reviewed - No data to display  EKG  EKG Interpretation None       Radiology No results found.  Procedures Procedures (including critical care time)  Medications Ordered in ED Medications  HYDROmorphone (DILAUDID) injection 1 mg (1 mg Intramuscular Given 11/11/16 2005)     Initial Impression / Assessment and Plan / ED Course  I have reviewed the triage vital signs and the nursing notes.  Pertinent labs & imaging results that were available during my care of the patient were reviewed by me and considered in my medical decision making (see chart for details).  Clinical Course     Patient will be placed in a posterior splint and advised to be nonweightbearing and strictly elevate the foot. He will not weight bear on the left leg until given okay by his surgeon. Patient has visited with our case manager. He is arranging for hotel for tonight. We'll arrange a ride there for him. He has Medicare and means. He's been given a walker here. He'll be instructed in its use by orthopedic tech.  Final Clinical Impressions(s) / ED Diagnoses   Final diagnoses:  Foot pain, left    New Prescriptions New Prescriptions   OXYCODONE-ACETAMINOPHEN (PERCOCET) 10-325 MG TABLET    Take 1 tablet by mouth every 4 (four) hours as needed for pain.  I personally performed the services described in this documentation, which was scribed in my presence. The recorded information has been reviewed and is accurate.     Rolland PorterMark Logen Heintzelman, MD 11/11/16 2032

## 2016-11-11 NOTE — Progress Notes (Signed)
Orthopedic Tech Progress Note Patient Details:  Wyvonnia LoraMelvin Kroenke 06/26/1971 [id number]  Ortho Devices Type of Ortho Device: Ace wrap, Post (short leg) splint Ortho Device/Splint Location: LLE Ortho Device/Splint Interventions: Ordered, Application   Jennye MoccasinHughes, Antonia Jicha Craig 11/11/2016, 9:28 PM

## 2016-11-11 NOTE — ED Triage Notes (Addendum)
Pt from home with post surgical right left foot pain. Pt had surgery last week for broken ankle. Pt reports increased pain without movement today. Pt had prescription for oxycodone 5's and using without relief, took 2 today, last was at 1800. Pt has post op boot in place and CMS intact. VS 170/100, HR 100, RR 24, spo2 98% on RA

## 2016-12-12 ENCOUNTER — Encounter (HOSPITAL_COMMUNITY): Payer: Self-pay | Admitting: Emergency Medicine

## 2016-12-12 ENCOUNTER — Emergency Department (HOSPITAL_COMMUNITY)
Admission: EM | Admit: 2016-12-12 | Discharge: 2016-12-12 | Disposition: A | Discharge status: Home / Self Care | Payer: Medicare Other | Attending: Emergency Medicine | Admitting: Emergency Medicine

## 2016-12-12 DIAGNOSIS — G8929 Other chronic pain: Secondary | ICD-10-CM | POA: Diagnosis not present

## 2016-12-12 DIAGNOSIS — R109 Unspecified abdominal pain: Secondary | ICD-10-CM | POA: Diagnosis not present

## 2016-12-12 DIAGNOSIS — J45909 Unspecified asthma, uncomplicated: Secondary | ICD-10-CM | POA: Insufficient documentation

## 2016-12-12 DIAGNOSIS — I10 Essential (primary) hypertension: Secondary | ICD-10-CM | POA: Insufficient documentation

## 2016-12-12 DIAGNOSIS — M545 Low back pain: Secondary | ICD-10-CM | POA: Diagnosis not present

## 2016-12-12 DIAGNOSIS — M25572 Pain in left ankle and joints of left foot: Secondary | ICD-10-CM

## 2016-12-12 DIAGNOSIS — K0889 Other specified disorders of teeth and supporting structures: Secondary | ICD-10-CM | POA: Diagnosis present

## 2016-12-12 DIAGNOSIS — K047 Periapical abscess without sinus: Secondary | ICD-10-CM | POA: Diagnosis not present

## 2016-12-12 DIAGNOSIS — Z79899 Other long term (current) drug therapy: Secondary | ICD-10-CM | POA: Insufficient documentation

## 2016-12-12 MED ORDER — PENICILLIN V POTASSIUM 500 MG PO TABS: 500.0000 mg | ORAL_TABLET | Freq: Four times a day (QID) | ORAL | 0 refills | Status: AC

## 2016-12-12 MED ORDER — OXYCODONE-ACETAMINOPHEN 5-325 MG PO TABS
1.0000 | ORAL_TABLET | Freq: Once | ORAL | Status: AC
  Administered 2016-12-12: 1 via ORAL
  Filled 2016-12-12: qty 1

## 2016-12-12 NOTE — ED Provider Notes (Signed)
[facility]-EMERGENCY DEPT Provider Note   CSN: [id number] Arrival date & time: 12/12/16  1231   By signing my name below, I, Cynda Acres, attest that this documentation has been prepared under the direction and in the presence of Jaynie Crumble, PA-C Electronically Signed: Cynda Acres, Scribe. 12/12/16. 1:13 PM.  History   Chief Complaint Chief Complaint  Patient presents with  . Dental Pain  . Back Pain   HPI Comments: Thomas Holloway is a 46 y.o. male with a hx of an ankle fracture resulting in surgery, GERD, and HTN, who presents to the Emergency Department complaining of multiple complaints. Patient states he Has poor dentition and developed right facial swelling, and states he may have bit on his cheeks today. Patient reports pain and swelling to the mouth and cheek.  He aslo reports that he broke his ankle a few weeks ago, was seen at [facility] for this, states was placed in the [facility] and has a walker that he uses for ambulation. He states he was prescribed some pain medicine and he has been taking but is not helping. He states he also ran out of it. He is requesting treatment for his ankle pain. Patient is also complaining of lower back pain. He states this is chronic issue. He has seen his doctor for this in the past and was given "shots in the back." He states that he would like some pain medicine for this as well.  The history is provided by the patient. No language interpreter was used.    Past Medical History:  Diagnosis Date  . Ankle fracture   . Asthma   . Bronchitis   . Constipation   . GERD (gastroesophageal reflux disease)   . Hemorrhoid   . Hypertension     Patient Active Problem List   Diagnosis Date Noted  . Routine general medical examination at a health care facility 07/05/2012  . Hyperhidrosis 07/05/2012  . Blood in stool 07/05/2012  . Need for Tdap vaccination 07/05/2012  . Tinea cruris 07/05/2012    History reviewed. No pertinent surgical  history.     Home Medications    Prior to Admission medications   Medication Sig Start Date End Date Taking? Authorizing Provider  albuterol (PROVENTIL HFA;VENTOLIN HFA) 108 (90 BASE) MCG/ACT inhaler Inhale 2 puffs into the lungs every 6 (six) hours as needed for wheezing or shortness of breath.    Historical Provider, MD  clotrimazole (LOTRIMIN) 1 % cream Apply 1 application topically 3 (three) times daily.    Historical Provider, MD  hyoscyamine (LEVSIN SL) 0.125 MG SL tablet Place 0.125 mg under the tongue every 4 (four) hours as needed for cramping.    Historical Provider, MD  methocarbamol (ROBAXIN) 500 MG tablet Take 1 tablet (500 mg total) by mouth 2 (two) times daily. 10/29/15   Stevi Barrett, PA-C  metoCLOPramide (REGLAN) 10 MG tablet Take 1 tablet (10 mg total) by mouth every 6 (six) hours as needed for nausea (nausea/headache). 11/25/13   Charlynne Pander, MD  naproxen (NAPROSYN) 500 MG tablet Take 1 tablet (500 mg total) by mouth 2 (two) times daily. 10/29/15   Stevi Barrett, PA-C  oxyCODONE-acetaminophen (PERCOCET) 10-325 MG tablet Take 1 tablet by mouth every 4 (four) hours as needed for pain. 11/11/16   Rolland Porter, MD  penicillin v potassium (VEETID) 500 MG tablet Take 1 tablet (500 mg total) by mouth 4 (four) times daily. 06/25/14   Kaitlyn Szekalski, PA-C  polyethylene glycol powder (GLYCOLAX/MIRALAX) powder Take  255 g (1 Container total) by mouth 2 (two) times daily. Until daily soft stools  OTC 06/25/14   Emilia Beck, PA-C  traMADol (ULTRAM) 50 MG tablet Take 1 tablet (50 mg total) by mouth every 6 (six) hours as needed. 06/25/14   Emilia Beck, PA-C    Family History Family History  Problem Relation Age of Onset  . Stroke Mother   . Hypertension Father   . Diabetes Father   . Breast cancer Sister   . Alcohol abuse Neg Hx   . Heart disease Neg Hx   . Hyperlipidemia Neg Hx   . Kidney disease Neg Hx     Social History Social History  Substance Use Topics  .  Smoking status: Never Smoker  . Smokeless tobacco: Never Used  . Alcohol use Yes     Allergies   Shellfish allergy; Garlic; and Pork-derived products   Review of Systems Review of Systems  Constitutional: Negative for chills and fever.  HENT: Positive for dental problem and facial swelling.   Musculoskeletal: Positive for arthralgias, back pain and joint swelling.  Neurological: Positive for headaches. Negative for weakness.  All other systems reviewed and are negative.    Physical Exam Updated Vital Signs BP (!) 149/109 (BP Location: Left Arm)   Pulse 88   Temp 98.6 F (37 C) (Oral)   Resp 21   SpO2 95%   Physical Exam  Constitutional: He is oriented to person, place, and time. He appears well-developed.  HENT:  Head: Normocephalic and atraumatic.  Mouth/Throat: Oropharynx is clear and moist.  Right-sided facial swelling noted. There is edema to the right cheek, there is diffuse dental disease with right lower gum swelling. There is no trismus. There is no swelling under the tongue.  Eyes: Conjunctivae and EOM are normal. Pupils are equal, round, and reactive to light.  Neck: Normal range of motion. Neck supple.  Cardiovascular: Normal rate.   Pulmonary/Chest: Effort normal.  Abdominal: Soft. Bowel sounds are normal.  Musculoskeletal: Normal range of motion.  Patient has death compression stocking on the left leg, there is left ankle swelling noted. Tender to palpation diffusely over the ankle. Dorsal pedal pulses are intact. Normal foot.  Neurological: He is alert and oriented to person, place, and time.  Skin: Skin is warm and dry.     ED Treatments / Results  DIAGNOSTIC STUDIES: Oxygen Saturation is 95% on RA, normal by my interpretation.    COORDINATION OF CARE: 1:09 PM Discussed treatment plan with pt at bedside and pt agreed to plan.  Labs (all labs ordered are listed, but only abnormal results are displayed) Labs Reviewed - No data to display  EKG   EKG Interpretation None       Radiology No results found.  Procedures Procedures (including critical care time)  Medications Ordered in ED Medications - No data to display   Initial Impression / Assessment and Plan / ED Course  I have reviewed the triage vital signs and the nursing notes.  Pertinent labs & imaging results that were available during my care of the patient were reviewed by me and considered in my medical decision making (see chart for details).   patient is emergency room with multiple complaints. I think his main complaint is right facial pain and swelling, which is most likely caused by a dental abscess. Will start on penicillin. Patient has no evidence of Ludwig's angina based on exam at this time, I recommended following up with a dentist. She is  also complaining of persistent left ankle pain, from recent fracture. Patient will have to follow with his doctor who is treating his fracture to get refill on his narcotic pain medications. He does complain of chronic back pain. I instructed him to follow with his doctor regarding this as well. Patient agreed and voiced understanding. He was given Percocet and emergency department will be discharged home with penicillin and NSAIDs.   Vitals:   12/12/16 1257  BP: (!) 149/109  Pulse: 88  Resp: 21  Temp: 98.6 F (37 C)  TempSrc: Oral  SpO2: 95%    Final Clinical Impressions(s) / ED Diagnoses   Final diagnoses:  Dental abscess  Acute left ankle pain  Chronic midline low back pain, with sciatica presence unspecified    New Prescriptions New Prescriptions   PENICILLIN V POTASSIUM (VEETID) 500 MG TABLET    Take 1 tablet (500 mg total) by mouth 4 (four) times daily.   I personally performed the services described in this documentation, which was scribed in my presence. The recorded information has been reviewed and is accurate.    Jaynie Crumbleatyana Gunnard Dorrance, PA-C 12/12/16 1321    Azalia BilisKevin Campos, MD 12/12/16 [phone]

## 2016-12-12 NOTE — Discharge Instructions (Signed)
Take penicillin as prescribed until all gone for your mouth infection. Please follow-up with the dentist. Keep your fractured ankle elevated at home, did not walk on it. Ice. Continue your anti-inflammatories and pain medications that are prescribed by her doctor. If you need more pain medications, please call your doctor that you're seeing for this. [facility]. Follow-up with your doctor regarding your chronic back pain.

## 2016-12-12 NOTE — ED Triage Notes (Signed)
Pt c/o continued pain to broken left ankle. Pt states accidentally bit inside of right jaw pta and "my whole mouth is infected. Pt also c/o lower back pain radiating into left leg since this am. Nad.

## 2017-01-08 ENCOUNTER — Emergency Department (HOSPITAL_COMMUNITY)
Admission: EM | Admit: 2017-01-08 | Discharge: 2017-01-09 | Disposition: A | Discharge status: Home / Self Care | Payer: Medicare Other | Attending: Emergency Medicine | Admitting: Emergency Medicine

## 2017-01-08 ENCOUNTER — Encounter (HOSPITAL_COMMUNITY): Payer: Self-pay | Admitting: Emergency Medicine

## 2017-01-08 DIAGNOSIS — X58XXXA Exposure to other specified factors, initial encounter: Secondary | ICD-10-CM | POA: Insufficient documentation

## 2017-01-08 DIAGNOSIS — Y999 Unspecified external cause status: Secondary | ICD-10-CM | POA: Insufficient documentation

## 2017-01-08 DIAGNOSIS — Y9389 Activity, other specified: Secondary | ICD-10-CM | POA: Diagnosis not present

## 2017-01-08 DIAGNOSIS — Z79899 Other long term (current) drug therapy: Secondary | ICD-10-CM | POA: Insufficient documentation

## 2017-01-08 DIAGNOSIS — S0993XA Unspecified injury of face, initial encounter: Secondary | ICD-10-CM | POA: Diagnosis not present

## 2017-01-08 DIAGNOSIS — I10 Essential (primary) hypertension: Secondary | ICD-10-CM | POA: Diagnosis not present

## 2017-01-08 DIAGNOSIS — Y929 Unspecified place or not applicable: Secondary | ICD-10-CM | POA: Insufficient documentation

## 2017-01-08 DIAGNOSIS — J45909 Unspecified asthma, uncomplicated: Secondary | ICD-10-CM | POA: Insufficient documentation

## 2017-01-08 DIAGNOSIS — K0889 Other specified disorders of teeth and supporting structures: Secondary | ICD-10-CM | POA: Insufficient documentation

## 2017-01-08 DIAGNOSIS — K5900 Constipation, unspecified: Secondary | ICD-10-CM | POA: Insufficient documentation

## 2017-01-08 NOTE — ED Triage Notes (Signed)
Pt states he bit his tongue earlier today and that it is "real swollen" and he can't eat anything. States his L index finger is numb as well.

## 2017-01-09 MED ORDER — AMOXICILLIN 500 MG PO CAPS: 500.0000 mg | ORAL_CAPSULE | Freq: Three times a day (TID) | ORAL | 0 refills | Status: AC

## 2017-01-09 MED ORDER — NAPROXEN 500 MG PO TABS: 500.0000 mg | ORAL_TABLET | Freq: Two times a day (BID) | ORAL | 0 refills | Status: DC

## 2017-01-09 MED ORDER — AMOXICILLIN 250 MG PO CAPS
500.0000 mg | ORAL_CAPSULE | Freq: Once | ORAL | Status: AC
  Administered 2017-01-09: 500 mg via ORAL
  Filled 2017-01-09: qty 2

## 2017-01-09 MED ORDER — NAPROXEN 250 MG PO TABS
500.0000 mg | ORAL_TABLET | Freq: Once | ORAL | Status: AC
  Administered 2017-01-09: 500 mg via ORAL
  Filled 2017-01-09: qty 2

## 2017-01-09 MED ORDER — DOCUSATE SODIUM 100 MG PO CAPS: 100.0000 mg | ORAL_CAPSULE | Freq: Two times a day (BID) | ORAL | 0 refills | Status: DC | PRN

## 2017-01-09 NOTE — ED Notes (Signed)
Raynelle FanningJulie PA aware of pt's blood pressure,

## 2017-01-09 NOTE — Discharge Instructions (Signed)
Call your doctor for a recheck if your symptoms persist or worsen. Maintain a soft food diet for a few days and use caution with chewing to try to avoid continuing to bite your tongue until it heals properly.   Additionally, your blood pressure is elevated tonight.  You do need a follow up with your doctor for a recheck of your blood pressure and further management if it remains elevated.

## 2017-01-11 NOTE — ED Provider Notes (Signed)
[facility] DEPT Provider Note   CSN: [id number] Arrival date & time: 01/08/17  2242     History   Chief Complaint Chief Complaint  Patient presents with  . bit tongue    HPI Thomas Holloway is a 46 y.o. male with several complaints, the first being he bit his tongue accidentally and now it is swollen and has bit it a few more times due to the swelling.  He is also concerned for a possible dental infection and he has intermittent swelling along his left lateral lower gingiva - there is no swelling at present but he endorses drainage and a bad taste coming from this location.  He denies fevers or chills.  Additionally, he spent several hours driving home today and noticed the tip of left index finger became numb and still is - suspects it may be from holding the steering wheel too tight.  He denies injury or any other numbness or weakness in his extremities.  He has found no alleviators..  The history is provided by the patient.    Past Medical History:  Diagnosis Date  . Ankle fracture   . Asthma   . Bronchitis   . Constipation   . GERD (gastroesophageal reflux disease)   . Hemorrhoid   . Hypertension     Patient Active Problem List   Diagnosis Date Noted  . Routine general medical examination at a health care facility 07/05/2012  . Hyperhidrosis 07/05/2012  . Blood in stool 07/05/2012  . Need for Tdap vaccination 07/05/2012  . Tinea cruris 07/05/2012    History reviewed. No pertinent surgical history.     Home Medications    Prior to Admission medications   Medication Sig Start Date End Date Taking? Authorizing Provider  albuterol (PROVENTIL HFA;VENTOLIN HFA) 108 (90 BASE) MCG/ACT inhaler Inhale 2 puffs into the lungs every 6 (six) hours as needed for wheezing or shortness of breath.   Yes Historical Provider, MD  clotrimazole (LOTRIMIN) 1 % cream Apply 1 application topically 3 (three) times daily.   Yes Historical Provider, MD  hyoscyamine (LEVSIN SL) 0.125 MG  SL tablet Place 0.125 mg under the tongue every 4 (four) hours as needed for cramping.   Yes Historical Provider, MD  methocarbamol (ROBAXIN) 500 MG tablet Take 1 tablet (500 mg total) by mouth 2 (two) times daily. 10/29/15  Yes Stevi Barrett, PA-C  metoCLOPramide (REGLAN) 10 MG tablet Take 1 tablet (10 mg total) by mouth every 6 (six) hours as needed for nausea (nausea/headache). 11/25/13  Yes Charlynne Panderavid Hsienta Yao, MD  oxyCODONE-acetaminophen (PERCOCET) 10-325 MG tablet Take 1 tablet by mouth every 4 (four) hours as needed for pain. 11/11/16  Yes Rolland PorterMark James, MD  polyethylene glycol powder (GLYCOLAX/MIRALAX) powder Take 255 g (1 Container total) by mouth 2 (two) times daily. Until daily soft stools  OTC 06/25/14  Yes Kaitlyn Szekalski, PA-C  traMADol (ULTRAM) 50 MG tablet Take 1 tablet (50 mg total) by mouth every 6 (six) hours as needed. 06/25/14  Yes Kaitlyn Szekalski, PA-C  amoxicillin (AMOXIL) 500 MG capsule Take 1 capsule (500 mg total) by mouth 3 (three) times daily. 01/09/17 01/19/17  Burgess AmorJulie Artavious Trebilcock, PA-C  docusate sodium (COLACE) 100 MG capsule Take 1 capsule (100 mg total) by mouth 2 (two) times daily as needed for mild constipation. 01/09/17   Burgess AmorJulie Chrystian Cupples, PA-C  naproxen (NAPROSYN) 500 MG tablet Take 1 tablet (500 mg total) by mouth 2 (two) times daily. 01/09/17   Burgess AmorJulie Emillia Weatherly, PA-C    Family  History Family History  Problem Relation Age of Onset  . Stroke Mother   . Hypertension Father   . Diabetes Father   . Breast cancer Sister   . Alcohol abuse Neg Hx   . Heart disease Neg Hx   . Hyperlipidemia Neg Hx   . Kidney disease Neg Hx     Social History Social History  Substance Use Topics  . Smoking status: Never Smoker  . Smokeless tobacco: Never Used  . Alcohol use Yes     Allergies   Shellfish allergy; Garlic; and Pork-derived products   Review of Systems Review of Systems  Constitutional: Negative for fever.  HENT: Negative for congestion and sore throat.        Negative except as  mentioned in HPI.   Eyes: Negative.   Respiratory: Negative for chest tightness and shortness of breath.   Cardiovascular: Negative for chest pain.  Gastrointestinal: Negative for abdominal pain and nausea.  Genitourinary: Negative.   Musculoskeletal: Negative for arthralgias, joint swelling and neck pain.  Skin: Negative.  Negative for rash and wound.  Neurological: Positive for numbness. Negative for dizziness, weakness, light-headedness and headaches.  Psychiatric/Behavioral: Negative.      Physical Exam Updated Vital Signs BP (!) 160/105   Pulse 87   Temp 97.7 F (36.5 C) (Oral)   Resp 18   Ht 5\' 9"  (1.753 m)   Wt (!) 154.2 kg   SpO2 98%   BMI 50.21 kg/m   Physical Exam  Constitutional: He is oriented to person, place, and time. He appears well-developed and well-nourished. No distress.  HENT:  Head: Normocephalic and atraumatic.  Right Ear: Tympanic membrane and external ear normal.  Left Ear: Tympanic membrane and external ear normal.  Mouth/Throat: Oropharynx is clear and moist and mucous membranes are normal. No oral lesions. No trismus in the jaw. No dental abscesses.  Mild erythema along left lateral lower gingiva.  No fluctuance.  He has swelling along his left mid tongue from trauma, no laceration noted.  Eyes: Conjunctivae are normal.  Neck: Normal range of motion. Neck supple.  Cardiovascular: Normal rate and normal heart sounds.   Pulmonary/Chest: Effort normal.  Abdominal: He exhibits no distension.  Musculoskeletal: Normal range of motion.       Left hand: He exhibits disruption of two-point discrimination. He exhibits normal range of motion, no bony tenderness, normal capillary refill and no swelling. Normal strength noted.  Pt displays full grip strength left hand and fingers.  Less than 2 sec cap refill in fingertips.  He has no sensation of the distal left index fingertip, but has full sensation of the proximal and middle phalanx.    Lymphadenopathy:     He has no cervical adenopathy.  Neurological: He is alert and oriented to person, place, and time.  Skin: Skin is warm and dry. No erythema.  Psychiatric: He has a normal mood and affect.     ED Treatments / Results  Labs (all labs ordered are listed, but only abnormal results are displayed) Labs Reviewed - No data to display  EKG  EKG Interpretation None       Radiology No results found.  Procedures Procedures (including critical care time)  Medications Ordered in ED Medications  naproxen (NAPROSYN) tablet 500 mg (500 mg Oral Given 01/09/17 0112)  amoxicillin (AMOXIL) capsule 500 mg (500 mg Oral Given 01/09/17 0112)     Initial Impression / Assessment and Plan / ED Course  I have reviewed the triage vital  signs and the nursing notes.  Pertinent labs & imaging results that were available during my care of the patient were reviewed by me and considered in my medical decision making (see chart for details).     Naproxen prescribed for dental/tongue pain and swelling.  Amoxil given. Left distal index fingertip numbness of unclear etiology.  No proximal finger numbness or weakness and remaining fingers on this hand full strength and sensation.  Advised watchful waiting, but f/u with pcp for a recheck if sx persist - I suspect this will resolve spontaneously - no pattern of numbness concerning for central source of sensation loss.  Amoxil for suspected dental infection.  Advised f/u with pcp for a recheck of bp  - discussed elevation.  Pt will call for office recheck.  The patient appears reasonably screened and/or stabilized for discharge and I doubt any other medical condition or other [facility] requiring further screening, evaluation, or treatment in the ED at this time prior to discharge.  Prior to dc, he also mentions that he has had some hard stools, denies rectal bleeding, although does have history of hemorrhoids.  Colace prescribed.    Final Clinical Impressions(s) / ED  Diagnoses   Final diagnoses:  Tongue injury, initial encounter  Pain, dental  Constipation, unspecified constipation type    New Prescriptions Discharge Medication List as of 01/09/2017  1:08 AM    START taking these medications   Details  amoxicillin (AMOXIL) 500 MG capsule Take 1 capsule (500 mg total) by mouth 3 (three) times daily., Starting Sun 01/09/2017, Until Wed 01/19/2017, Print    docusate sodium (COLACE) 100 MG capsule Take 1 capsule (100 mg total) by mouth 2 (two) times daily as needed for mild constipation., Starting Sun 01/09/2017, Print         Burgess Amor, PA-C 01/11/17 2101    Glynn Octave, MD 01/14/17 0830

## 2017-04-08 ENCOUNTER — Emergency Department (HOSPITAL_COMMUNITY): Payer: Medicare Other

## 2017-04-08 ENCOUNTER — Encounter (HOSPITAL_COMMUNITY): Payer: Self-pay | Admitting: Emergency Medicine

## 2017-04-08 ENCOUNTER — Emergency Department (HOSPITAL_COMMUNITY)
Admission: EM | Admit: 2017-04-08 | Discharge: 2017-04-08 | Disposition: A | Discharge status: Home / Self Care | Payer: Medicare Other | Attending: Emergency Medicine | Admitting: Emergency Medicine

## 2017-04-08 DIAGNOSIS — R05 Cough: Secondary | ICD-10-CM | POA: Diagnosis not present

## 2017-04-08 DIAGNOSIS — R21 Rash and other nonspecific skin eruption: Secondary | ICD-10-CM | POA: Diagnosis not present

## 2017-04-08 DIAGNOSIS — J45909 Unspecified asthma, uncomplicated: Secondary | ICD-10-CM | POA: Insufficient documentation

## 2017-04-08 DIAGNOSIS — Z79899 Other long term (current) drug therapy: Secondary | ICD-10-CM | POA: Diagnosis not present

## 2017-04-08 DIAGNOSIS — I10 Essential (primary) hypertension: Secondary | ICD-10-CM | POA: Diagnosis not present

## 2017-04-08 DIAGNOSIS — R059 Cough, unspecified: Secondary | ICD-10-CM

## 2017-04-08 MED ORDER — BENZONATATE 100 MG PO CAPS: 100.0000 mg | ORAL_CAPSULE | Freq: Three times a day (TID) | ORAL | 0 refills | Status: DC | PRN

## 2017-04-08 MED ORDER — DEXAMETHASONE 4 MG PO TABS
12.0000 mg | ORAL_TABLET | Freq: Once | ORAL | Status: AC
  Administered 2017-04-08: 12 mg via ORAL
  Filled 2017-04-08: qty 3

## 2017-04-08 MED ORDER — IPRATROPIUM-ALBUTEROL 0.5-2.5 (3) MG/3ML IN SOLN
3.0000 mL | Freq: Once | RESPIRATORY_TRACT | Status: AC
  Administered 2017-04-08: 3 mL via RESPIRATORY_TRACT
  Filled 2017-04-08: qty 3

## 2017-04-08 MED ORDER — OMEPRAZOLE 20 MG PO CPDR: 20.0000 mg | DELAYED_RELEASE_CAPSULE | Freq: Every day | ORAL | 3 refills | Status: DC

## 2017-04-08 MED ORDER — CETIRIZINE HCL 10 MG PO CHEW: 10.0000 mg | CHEWABLE_TABLET | Freq: Every day | ORAL | 2 refills | Status: DC

## 2017-04-08 NOTE — Discharge Instructions (Signed)
You are written for medications for your reflux, your allergies, and your cough. Take medications as prescribed. Return for worsening symptoms, including fever, difficulty breathing, passing out or any other symptoms concerning to you.

## 2017-04-08 NOTE — ED Triage Notes (Signed)
Patient states that he has a cough, that has been going on for the past couple of days.  Thought it was coming from the pollen.  States that he has a rash under his arms and in between his legs.

## 2017-04-08 NOTE — ED Triage Notes (Signed)
Patient has multiple complaints in triage. States "I've had a dry cough for 3 days, my blood pressure is up, I'm lightheaded, I have acid reflux, and I have a little bowel leakage from my bottom."

## 2017-04-08 NOTE — ED Provider Notes (Signed)
[facility] Provider Note   CSN: [id number] Arrival date & time: 04/08/17  1659     History   Chief Complaint Chief Complaint  Patient presents with  . Multiple Complaints    HPI Thomas Holloway is a 46 y.o. male.  HPI  [age] year male who presents with multiple complaints. He has a history of asthma, GERD, and hypertension. States that he has had 3 days of nonproductive cough with runny nose. Does intermittently feel short of breath, but no significant dyspnea on exertion or shortness of breath at rest currently. No chest pains, fevers or chills, congestion or sore throat. No known sick contacts. Patient is unsure but states that he may have had cough variant asthma in the past which may feel similar. Did take his inhaler without improvement. He states that his allergies has also been recently flaring up. No nausea, vomiting, diarrhea or abdominal pain. Also states that he may have a rash underneath his armpits and perianally. States that his skin is very raw from leakage of stool sometimes from his rectum, and at times very itchy with burning pain. Denies any new soaps, lotions, medications, or detergents.  Past Medical History:  Diagnosis Date  . Ankle fracture   . Asthma   . Bronchitis   . Constipation   . GERD (gastroesophageal reflux disease)   . Hemorrhoid   . Hypertension     Patient Active Problem List   Diagnosis Date Noted  . Routine general medical examination at a health care facility 07/05/2012  . Hyperhidrosis 07/05/2012  . Blood in stool 07/05/2012  . Need for Tdap vaccination 07/05/2012  . Tinea cruris 07/05/2012    History reviewed. No pertinent surgical history.     Home Medications    Prior to Admission medications   Medication Sig Start Date End Date Taking? Authorizing Provider  albuterol (PROVENTIL HFA;VENTOLIN HFA) 108 (90 BASE) MCG/ACT inhaler Inhale 2 puffs into the lungs every 6 (six) hours as needed for wheezing or shortness of breath.     [provider]  benzonatate (TESSALON) 100 MG capsule Take 1 capsule (100 mg total) by mouth 3 (three) times daily as needed for cough. 04/08/17   Lavera GuiseLiu, Rayson Rando Duo, MD  cetirizine (ZYRTEC) 10 MG chewable tablet Chew 1 tablet (10 mg total) by mouth daily. 04/08/17   Lavera GuiseLiu, Jackline Castilla Duo, MD  omeprazole (PRILOSEC) 20 MG capsule Take 1 capsule (20 mg total) by mouth daily. 04/08/17   Lavera GuiseLiu, Marliyah Reid Duo, MD    Family History Family History  Problem Relation Age of Onset  . Stroke Mother   . Hypertension Father   . Diabetes Father   . Breast cancer Sister   . Alcohol abuse Neg Hx   . Heart disease Neg Hx   . Hyperlipidemia Neg Hx   . Kidney disease Neg Hx     Social History Social History  Substance Use Topics  . Smoking status: Never Smoker  . Smokeless tobacco: Never Used  . Alcohol use Yes     Comment: occasionally     Allergies   Fish allergy; Shellfish allergy; Garlic; Onion; and Pork-derived products   Review of Systems Review of Systems  Constitutional: Negative for fever.  HENT: Positive for rhinorrhea. Negative for congestion.   Respiratory: Positive for cough. Negative for shortness of breath.   Cardiovascular: Negative for chest pain and leg swelling.  Gastrointestinal: Negative for abdominal pain.  Skin: Positive for rash.  Allergic/Immunologic: Negative for immunocompromised state.  Hematological: Does not  bruise/bleed easily.  All other systems reviewed and are negative.    Physical Exam Updated Vital Signs BP (!) 152/93 (BP Location: Right Arm)   Pulse 95   Temp 98.2 F (36.8 C) (Oral)   Resp (!) 22   Ht 5\' 9"  (1.753 m)   Wt (!) 340 lb (154.2 kg)   SpO2 98%   BMI 50.21 kg/m   Physical Exam Physical Exam  Nursing note and vitals reviewed. Constitutional: Well developed, well nourished, non-toxic, and in no acute distress Head: Normocephalic and atraumatic.  Mouth/Throat: Oropharynx is clear and moist.  Neck: Normal range of motion. Neck supple.    Cardiovascular: Normal rate and regular rhythm.   Pulmonary/Chest: Effort normal and breath sounds normal with mild bronchospastic cough.  Abdominal: Soft. There is no tenderness. There is no rebound and no guarding. There is irritated moist skin around the perianal area. No rash or lesions. No area of fluctuance or induration. Musculoskeletal: Normal range of motion.  Neurological: Alert, no facial droop, fluent speech, moves all extremities symmetrically Skin: Skin is warm and dry. darkening of the skin folds of bilateral axilla without obvious rash or lesions.  Psychiatric: Cooperative   ED Treatments / Results  Labs (all labs ordered are listed, but only abnormal results are displayed) Labs Reviewed - No data to display  EKG  EKG Interpretation None       Radiology Dg Chest 2 View  Result Date: 04/08/2017 CLINICAL DATA:  Nonproductive cough. Shortness of breath for 3 days. EXAM: CHEST  2 VIEW COMPARISON:  None. FINDINGS: Heart size is normal. The lungs are clear. The visualized soft tissues and bony thorax are unremarkable. Lung volumes are low. IMPRESSION: 1. Low lung volumes. 2. No acute cardiopulmonary disease. Electronically Signed   By: Marin Roberts M.D.   On: 04/08/2017 18:31    Procedures Procedures (including critical care time)  Medications Ordered in ED Medications  ipratropium-albuterol (DUONEB) 0.5-2.5 (3) MG/3ML nebulizer solution 3 mL (3 mLs Nebulization Given 04/08/17 1807)  dexamethasone (DECADRON) tablet 12 mg (12 mg Oral Given 04/08/17 1746)     Initial Impression / Assessment and Plan / ED Course  I have reviewed the triage vital signs and the nursing notes.  Pertinent labs & imaging results that were available during my care of the patient were reviewed by me and considered in my medical decision making (see chart for details).     Presenting with 3 days of dry cough and concern for possible rash perianally underneath his axilla. He is  well-appearing in no acute distress with normal vitals. His lungs are Clear but there is a mild bronchospastic cough. Question if this may be cough variant asthma versus URI. CXR visualized and shows no pneumonia, edema or other acute processes. Did give dose of decadron for ? Of asthma variant.   Also requesting medications for his allergies, GERD, and cough.   In regards to his rash, or anally there is moist and irritated skin. No evidence of cellulitis or abscess. Sometimes have some mild leakage of stool, likely causing moisture and skin irritation. Discussed supportive management by keeping this area very dry. No obvious rash underneath his axilla, but there is darkening of the skin folds. No pain or irritation or itching. Does not seem like allergic rash or infectious skin changes.   The patient appears reasonably screened and/or stabilized for discharge and I doubt any other medical condition or other [facility] requiring further screening, evaluation, or treatment in the ED at  this time prior to discharge.  Strict return and follow-up instructions reviewed. He expressed understanding of all discharge instructions and felt comfortable with the plan of care.     Final Clinical Impressions(s) / ED Diagnoses   Final diagnoses:  Cough    New Prescriptions New Prescriptions   BENZONATATE (TESSALON) 100 MG CAPSULE    Take 1 capsule (100 mg total) by mouth 3 (three) times daily as needed for cough.   CETIRIZINE (ZYRTEC) 10 MG CHEWABLE TABLET    Chew 1 tablet (10 mg total) by mouth daily.   OMEPRAZOLE (PRILOSEC) 20 MG CAPSULE    Take 1 capsule (20 mg total) by mouth daily.     Lavera Guise, MD 04/08/17 [phone]

## 2017-04-19 ENCOUNTER — Emergency Department (HOSPITAL_COMMUNITY)
Admission: EM | Admit: 2017-04-19 | Discharge: 2017-04-20 | Disposition: A | Discharge status: Home / Self Care | Payer: Medicare Other | Attending: Emergency Medicine | Admitting: Emergency Medicine

## 2017-04-19 ENCOUNTER — Encounter (HOSPITAL_COMMUNITY): Payer: Self-pay | Admitting: *Deleted

## 2017-04-19 ENCOUNTER — Emergency Department (HOSPITAL_COMMUNITY): Payer: Medicare Other

## 2017-04-19 DIAGNOSIS — X503XXA Overexertion from repetitive movements, initial encounter: Secondary | ICD-10-CM | POA: Insufficient documentation

## 2017-04-19 DIAGNOSIS — Y9389 Activity, other specified: Secondary | ICD-10-CM | POA: Diagnosis not present

## 2017-04-19 DIAGNOSIS — Z7982 Long term (current) use of aspirin: Secondary | ICD-10-CM | POA: Insufficient documentation

## 2017-04-19 DIAGNOSIS — I10 Essential (primary) hypertension: Secondary | ICD-10-CM | POA: Diagnosis not present

## 2017-04-19 DIAGNOSIS — Y999 Unspecified external cause status: Secondary | ICD-10-CM | POA: Insufficient documentation

## 2017-04-19 DIAGNOSIS — H6121 Impacted cerumen, right ear: Secondary | ICD-10-CM | POA: Insufficient documentation

## 2017-04-19 DIAGNOSIS — J45909 Unspecified asthma, uncomplicated: Secondary | ICD-10-CM | POA: Diagnosis not present

## 2017-04-19 DIAGNOSIS — Z79899 Other long term (current) drug therapy: Secondary | ICD-10-CM | POA: Insufficient documentation

## 2017-04-19 DIAGNOSIS — S39012A Strain of muscle, fascia and tendon of lower back, initial encounter: Secondary | ICD-10-CM

## 2017-04-19 DIAGNOSIS — K5900 Constipation, unspecified: Secondary | ICD-10-CM | POA: Diagnosis not present

## 2017-04-19 DIAGNOSIS — Y929 Unspecified place or not applicable: Secondary | ICD-10-CM | POA: Diagnosis not present

## 2017-04-19 DIAGNOSIS — M545 Low back pain: Secondary | ICD-10-CM | POA: Diagnosis present

## 2017-04-19 MED ORDER — CARBAMIDE PEROXIDE 6.5 % OT SOLN
10.0000 [drp] | Freq: Once | OTIC | Status: AC
Start: 2017-04-19 — End: 2017-04-19
  Administered 2017-04-19: 10 [drp] via OTIC

## 2017-04-19 MED ORDER — CARBAMIDE PEROXIDE 6.5 % OT SOLN
OTIC | Status: AC
  Filled 2017-04-19: qty 15

## 2017-04-19 NOTE — ED Triage Notes (Signed)
Pt reports stiffing in his mid and low back x 3 days. Pt also states he has a rash on his bottom from known bowel leakage. Pt also reports right sided ear ache x 4 days.

## 2017-04-19 NOTE — ED Notes (Signed)
Pt is sleeping with snoring respirations. Pt has not coughed since arrival to room.

## 2017-04-19 NOTE — ED Notes (Signed)
ED Provider at bedside. 

## 2017-04-20 DIAGNOSIS — S39012A Strain of muscle, fascia and tendon of lower back, initial encounter: Secondary | ICD-10-CM | POA: Diagnosis not present

## 2017-04-20 MED ORDER — POLYETHYLENE GLYCOL 3350 17 G PO PACK: 17.0000 g | PACK | Freq: Two times a day (BID) | ORAL | 0 refills | Status: DC

## 2017-04-20 MED ORDER — KETOROLAC TROMETHAMINE 60 MG/2ML IM SOLN
60.0000 mg | Freq: Once | INTRAMUSCULAR | Status: AC
  Administered 2017-04-20: 60 mg via INTRAMUSCULAR
  Filled 2017-04-20: qty 2

## 2017-04-20 MED ORDER — HYDROCORTISONE 2.5 % RE CREA: 1.0000 "application " | TOPICAL_CREAM | Freq: Two times a day (BID) | RECTAL | 0 refills | Status: DC

## 2017-04-20 NOTE — ED Provider Notes (Signed)
[facility]-EMERGENCY DEPT Provider Note   CSN: [id number] Arrival date & time: 04/19/17  2019     History   Chief Complaint Chief Complaint  Patient presents with  . Back Pain    HPI Thomas Holloway is a 46 y.o. male.  HPI   Thomas Holloway is a 46 y.o. male who presents to the Emergency Department complaining of right ear pain, low back pain and rash to his perirectal area.  He states that pain has been present for 4 days.  He describes a dull, throbbing pain to the right ear associated with yellow drainage.  Pain improves with cotton plug in his ear.  He also complains of "stiffness" to his lower back associated with movement.  He states the pain is on going but worse for few days.  He also reports having a rash to his rectal area and raw skin secondary to frequent "leakage" of stool.  He notes having occasional itching of his rectal area as well and hx of hemorrhoids.    Past Medical History:  Diagnosis Date  . Ankle fracture   . Asthma   . Bronchitis   . Constipation   . GERD (gastroesophageal reflux disease)   . Hemorrhoid   . Hypertension     Patient Active Problem List   Diagnosis Date Noted  . Routine general medical examination at a health care facility 07/05/2012  . Hyperhidrosis 07/05/2012  . Blood in stool 07/05/2012  . Need for Tdap vaccination 07/05/2012  . Tinea cruris 07/05/2012    History reviewed. No pertinent surgical history.     Home Medications    Prior to Admission medications   Medication Sig Start Date End Date Taking? Authorizing Provider  albuterol (PROVENTIL HFA;VENTOLIN HFA) 108 (90 BASE) MCG/ACT inhaler Inhale 2 puffs into the lungs every 6 (six) hours as needed for wheezing or shortness of breath.   Yes [provider]  benzonatate (TESSALON) 100 MG capsule Take 1 capsule (100 mg total) by mouth 3 (three) times daily as needed for cough. 04/08/17  Yes Lavera Guise, MD  cetirizine (ZYRTEC) 10 MG chewable tablet Chew 1 tablet (10 mg  total) by mouth daily. 04/08/17  Yes Lavera Guise, MD  omeprazole (PRILOSEC) 20 MG capsule Take 1 capsule (20 mg total) by mouth daily. 04/08/17  Yes Lavera Guise, MD    Family History Family History  Problem Relation Age of Onset  . Stroke Mother   . Hypertension Father   . Diabetes Father   . Breast cancer Sister   . Alcohol abuse Neg Hx   . Heart disease Neg Hx   . Hyperlipidemia Neg Hx   . Kidney disease Neg Hx     Social History Social History  Substance Use Topics  . Smoking status: Never Smoker  . Smokeless tobacco: Never Used  . Alcohol use Yes     Comment: occasionally     Allergies   Fish allergy; Shellfish allergy; Garlic; Onion; and Pork-derived products   Review of Systems Review of Systems  Constitutional: Negative for fever.  HENT: Positive for ear pain. Negative for congestion, sinus pressure and sore throat.   Respiratory: Negative for shortness of breath.   Cardiovascular: Negative for chest pain.  Gastrointestinal: Positive for constipation. Negative for abdominal distention, abdominal pain, anal bleeding, blood in stool and vomiting.  Genitourinary: Negative for decreased urine volume, difficulty urinating, dysuria, flank pain and hematuria.  Musculoskeletal: Positive for back pain. Negative for joint swelling.  Skin: Negative  for rash.  Neurological: Negative for dizziness, syncope, weakness and numbness.  All other systems reviewed and are negative.    Physical Exam Updated Vital Signs BP (!) 147/93   Pulse 93   Temp 97.9 F (36.6 C) (Oral)   Resp 17   Ht 5\' 9"  (1.753 m)   Wt (!) 154.2 kg (340 lb)   SpO2 95%   BMI 50.21 kg/m   Physical Exam  Constitutional: He is oriented to person, place, and time. He appears well-developed and well-nourished. No distress.  HENT:  Head: Normocephalic and atraumatic.  Left Ear: Tympanic membrane normal.  Mouth/Throat: Uvula is midline, oropharynx is clear and moist and mucous membranes are normal.    Cerumen impaction of right ear canal.  Unable to visualize TM  Neck: Normal range of motion. Neck supple.  Cardiovascular: Normal rate, regular rhythm, normal heart sounds and intact distal pulses.   No murmur heard. Pulmonary/Chest: Effort normal and breath sounds normal. No respiratory distress.  Abdominal: Soft. He exhibits no distension and no mass. There is no tenderness. There is no rebound and no guarding.  Genitourinary: Rectal exam shows no external hemorrhoid, no fissure, no mass and anal tone normal.  Genitourinary Comments: Moisture and erythema of the perirectal area without palpable masses, abscess or external hemorrhoid. Some leakage of stool  Musculoskeletal: He exhibits tenderness. He exhibits no edema.       Lumbar back: He exhibits tenderness and pain. He exhibits normal range of motion, no swelling, no deformity, no laceration and normal pulse.  Diffuse ttp of the bilateral lumbar paraspinal muscles.  No spinal tenderness.  Pt has 5/5 strength against resistance of bilateral lower extremities.     Neurological: He is alert and oriented to person, place, and time. He has normal strength. No sensory deficit. He exhibits normal muscle tone. Coordination and gait normal.  Reflex Scores:      Patellar reflexes are 2+ on the right side and 2+ on the left side.      Achilles reflexes are 2+ on the right side and 2+ on the left side. Skin: Skin is warm and dry. Capillary refill takes less than 2 seconds. No rash noted.  Nursing note and vitals reviewed.    ED Treatments / Results  Labs (all labs ordered are listed, but only abnormal results are displayed) Labs Reviewed - No data to display  EKG  EKG Interpretation None       Radiology No results found.  Procedures Procedures (including critical care time)  Medications Ordered in ED Medications  carbamide peroxide (DEBROX) 6.5 % otic solution 10 drop (10 drops Right Ear Given 04/19/17 2330)     Initial  Impression / Assessment and Plan / ED Course  I have reviewed the triage vital signs and the nursing notes.  Pertinent labs & imaging results that were available during my care of the patient were reviewed by me and considered in my medical decision making (see chart for details).     Patient with a cerumen impaction of the right ear.  Debrox applied to the ear and canal was irrigated with warm saline with some resolution of pain and small amt of cerumen removed by me using a curette.  Pain improved.  Will dispense debrox for continued home use.    XR of abd shows moderate stool and known hx of constipation.  On exam, the perirectal area appears moist with stool.  There is no palpable rectal masses or abscess.  Of note,  pt was recently seen here for same.  He appears stable for d/c.  Low back pain without concerning sx's for emergent neuro process.  ambulates with steady gait.  Pt agrees to constipation tx with miralax and counseled on proper hygiene practices.  Return precautions discussed.      Final Clinical Impressions(s) / ED Diagnoses   Final diagnoses:  Strain of lumbar region, initial encounter  Impacted cerumen of right ear  Constipation in male    New Prescriptions New Prescriptions   No medications on file     Pauline Ausriplett, Ruchi Stoney, Cordelia Poche-C 04/21/17 1231    Loren RacerYelverton, David, MD 04/28/17 [phone]

## 2017-04-20 NOTE — Discharge Instructions (Signed)
Apply 5 drops of the debrox to the right ear 3 times a day for 5 days.  Drink plenty of water.  Follow-up with your doctor for recheck of the constipation.

## 2017-05-17 ENCOUNTER — Telehealth: Payer: Self-pay | Admitting: Internal Medicine

## 2017-05-17 NOTE — Telephone Encounter (Signed)
Can enter referral if you approve?

## 2017-05-17 NOTE — Telephone Encounter (Signed)
States patient went to ER on 30th of May.  States patient has leakage of stool and has some stomach issues.  Is requesting referral to be entered and sent to their office.

## 2017-05-22 NOTE — Telephone Encounter (Signed)
He needs to be seen

## 2017-05-23 NOTE — Telephone Encounter (Signed)
LVM for pt to call back as soon as possible.   RE: Pt needs an appt before referral can be entered.

## 2017-11-18 ENCOUNTER — Emergency Department (HOSPITAL_COMMUNITY)
Admission: EM | Admit: 2017-11-18 | Discharge: 2017-11-18 | Disposition: A | Discharge status: Home / Self Care | Payer: Medicare Other | Attending: Emergency Medicine | Admitting: Emergency Medicine

## 2017-11-18 ENCOUNTER — Encounter (HOSPITAL_COMMUNITY): Payer: Self-pay

## 2017-11-18 DIAGNOSIS — R21 Rash and other nonspecific skin eruption: Secondary | ICD-10-CM | POA: Diagnosis not present

## 2017-11-18 DIAGNOSIS — Z79899 Other long term (current) drug therapy: Secondary | ICD-10-CM | POA: Diagnosis not present

## 2017-11-18 DIAGNOSIS — J029 Acute pharyngitis, unspecified: Secondary | ICD-10-CM | POA: Diagnosis not present

## 2017-11-18 DIAGNOSIS — I10 Essential (primary) hypertension: Secondary | ICD-10-CM | POA: Diagnosis not present

## 2017-11-18 DIAGNOSIS — R0981 Nasal congestion: Secondary | ICD-10-CM | POA: Diagnosis not present

## 2017-11-18 DIAGNOSIS — J45909 Unspecified asthma, uncomplicated: Secondary | ICD-10-CM | POA: Diagnosis not present

## 2017-11-18 MED ORDER — ACETAMINOPHEN 500 MG PO TABS
1000.0000 mg | ORAL_TABLET | Freq: Once | ORAL | Status: AC
  Administered 2017-11-18: 1000 mg via ORAL
  Filled 2017-11-18: qty 2

## 2017-11-18 MED ORDER — HYDROCORTISONE 2.5 % EX CREA: TOPICAL_CREAM | Freq: Two times a day (BID) | CUTANEOUS | 0 refills | Status: AC

## 2017-11-18 NOTE — ED Triage Notes (Addendum)
Pt reports nasal congestion, sore throat, diarrhea, and rash around rectum.  Reports cold symptoms x 3 days but diarrhea and rash for several months.  Reports history of gastritis.  Pt also says has a rash behind ears and noticed an odor.  Pt says hasn't had diarrhea in 1-2 weeks.   No vomiting

## 2017-11-18 NOTE — Discharge Instructions (Signed)
For the nasal congestion, use Afrin nasal spray 1 in each nostril twice a day for 3 or 4 days.  If you continue to have problems after that follow-up with your primary care doctor.  For the rash we are prescribing hydrocortisone cream which should help.  You can use this on the areas behind her ear, and around her anus, 2 or 3 times a day as needed.  Her blood pressure was minimally elevated and should be rechecked by your primary care doctor in 3 or 4 weeks.

## 2017-11-18 NOTE — ED Provider Notes (Signed)
[facility] EMERGENCY DEPARTMENT Provider Note   CSN: [id number] Arrival date & time: 11/18/17  1016     History   Chief Complaint Chief Complaint  Patient presents with  . Sore Throat    HPI Thomas Holloway is a 46 y.o. male.  He is here for evaluation of nasal congestion, and a rash.  Nasal congestion present for 3 days.  He is not producing much when he blows his nose.  He denies cough, shortness of breath, chest pain, weakness, fever, or back pain.  He also complains of a rash behind his ears, and around his anus.  Both of these are recurrent and he is treated in the past with "creams", prescribed by a physician.  He does not currently have a primary care provider.  There are no other known modifying factors. HPI  Past Medical History:  Diagnosis Date  . Ankle fracture   . Asthma   . Bronchitis   . Constipation   . GERD (gastroesophageal reflux disease)   . Hemorrhoid   . Hypertension     Patient Active Problem List   Diagnosis Date Noted  . Routine general medical examination at a health care facility 07/05/2012  . Hyperhidrosis 07/05/2012  . Blood in stool 07/05/2012  . Need for Tdap vaccination 07/05/2012  . Tinea cruris 07/05/2012    History reviewed. No pertinent surgical history.     Home Medications    Prior to Admission medications   Medication Sig Start Date End Date Taking? Authorizing Provider  albuterol (PROVENTIL HFA;VENTOLIN HFA) 108 (90 BASE) MCG/ACT inhaler Inhale 2 puffs into the lungs every 6 (six) hours as needed for wheezing or shortness of breath.    [provider]  benzonatate (TESSALON) 100 MG capsule Take 1 capsule (100 mg total) by mouth 3 (three) times daily as needed for cough. 04/08/17   Lavera GuiseLiu, Dana Duo, MD  cetirizine (ZYRTEC) 10 MG chewable tablet Chew 1 tablet (10 mg total) by mouth daily. 04/08/17   Lavera GuiseLiu, Dana Duo, MD  hydrocortisone (ANUSOL-HC) 2.5 % rectal cream Place 1 application rectally 2 (two) times daily. 04/20/17    Triplett, Tammy, PA-C  hydrocortisone 2.5 % cream Apply topically 2 (two) times daily. 11/18/17   Mancel BaleWentz, Lee Kuang, MD  omeprazole (PRILOSEC) 20 MG capsule Take 1 capsule (20 mg total) by mouth daily. 04/08/17   Lavera GuiseLiu, Dana Duo, MD  polyethylene glycol [facility](MIRALAX) packet Take 17 g by mouth 2 (two) times daily. Mix in 8 oz of water or juice. Take daily until relief of constipation 04/20/17   Triplett, Tammy, PA-C    Family History Family History  Problem Relation Age of Onset  . Stroke Mother   . Hypertension Father   . Diabetes Father   . Breast cancer Sister   . Alcohol abuse Neg Hx   . Heart disease Neg Hx   . Hyperlipidemia Neg Hx   . Kidney disease Neg Hx     Social History Social History   Tobacco Use  . Smoking status: Never Smoker  . Smokeless tobacco: Never Used  Substance Use Topics  . Alcohol use: Yes    Comment: occasionally  . Drug use: No     Allergies   Fish allergy; Shellfish allergy; Garlic; Onion; and Pork-derived products   Review of Systems Review of Systems  All other systems reviewed and are negative.    Physical Exam Updated Vital Signs BP 129/88   Pulse 88   Temp 98.3 F (36.8 C) (Oral)  Resp 20   Ht 5\' 9"  (1.753 m)   Wt 131.5 kg (290 lb)   SpO2 98%   BMI 42.83 kg/m   Physical Exam  Constitutional: He is oriented to person, place, and time. He appears well-developed and well-nourished. He does not appear ill.  Overweight  HENT:  Head: Normocephalic and atraumatic.  Right Ear: External ear normal.  Left Ear: External ear normal.  Eyes: Conjunctivae and EOM are normal. Pupils are equal, round, and reactive to light.  Neck: Normal range of motion and phonation normal. Neck supple.  Cardiovascular: Normal rate, regular rhythm and normal heart sounds.  Pulmonary/Chest: Effort normal and breath sounds normal. He exhibits no bony tenderness.  Abdominal: Soft. There is no tenderness.  Musculoskeletal: Normal range of motion.  Neurological:  He is alert and oriented to person, place, and time. No cranial nerve deficit or sensory deficit. He exhibits normal muscle tone. Coordination normal.  Skin: Skin is warm, dry and intact.  Postauricular regions, near the lobes, have slight raised and red areas consistent with allergic dermatitis.  Area around the anus does not have a visible rash.  Anus appears normal.  Psychiatric: He has a normal mood and affect. His behavior is normal. Judgment and thought content normal.  Nursing note and vitals reviewed.    ED Treatments / Results  Labs (all labs ordered are listed, but only abnormal results are displayed) Labs Reviewed - No data to display  EKG  EKG Interpretation None       Radiology No results found.  Procedures Procedures (including critical care time)  Medications Ordered in ED Medications  acetaminophen (TYLENOL) tablet 1,000 mg (1,000 mg Oral Given 11/18/17 1407)     Initial Impression / Assessment and Plan / ED Course  I have reviewed the triage vital signs and the nursing notes.  Pertinent labs & imaging results that were available during my care of the patient were reviewed by me and considered in my medical decision making (see chart for details).      Patient Vitals for the past 24 hrs:  BP Temp Temp src Pulse Resp SpO2 Height Weight  11/18/17 1433 129/88 - - 88 20 98 % - -  11/18/17 1027 (!) 140/109 98.3 F (36.8 C) Oral 86 20 97 % 5\' 9"  (1.753 m) 131.5 kg (290 lb)    3:28 PM Reevaluation with update and discussion. After initial assessment and treatment, an updated evaluation reveals no change in clinical status.  Findings discussed with the patient and all questions were answered. Mancel BaleElliott Tenee Wish    Final Clinical Impressions(s) / ED Diagnoses   Final diagnoses:  Rash  Nasal congestion    Nonspecific rash nasal congestion.  Both are amenable to outpatient treatment.  Blood pressure elevation improved on recheck.  Doubt serious bacterial  infection or metabolic instability.  Nursing Notes Reviewed/ Care Coordinated Applicable Imaging Reviewed Interpretation of Laboratory Data incorporated into ED treatment  The patient appears reasonably screened and/or stabilized for discharge and I doubt any other medical condition or other [facility] requiring further screening, evaluation, or treatment in the ED at this time prior to discharge.  Plan: Home Medications-OTC as needed; Home Treatments-rest; return here if the recommended treatment, does not improve the symptoms; Recommended follow up-PCP of choice as needed   ED Discharge Orders        Ordered    hydrocortisone 2.5 % cream  2 times daily     11/18/17 1521  Mancel Bale, MD 11/18/17 [phone]

## 2018-01-12 ENCOUNTER — Ambulatory Visit (INDEPENDENT_AMBULATORY_CARE_PROVIDER_SITE_OTHER): Payer: Medicare Other | Admitting: Family Medicine

## 2018-01-12 ENCOUNTER — Encounter: Payer: Self-pay | Admitting: Family Medicine

## 2018-01-12 VITALS — BP 158/92 | HR 69 | Temp 98.5°F | Resp 16 | Ht 69.0 in | Wt 365.8 lb

## 2018-01-12 DIAGNOSIS — Z113 Encounter for screening for infections with a predominantly sexual mode of transmission: Secondary | ICD-10-CM | POA: Diagnosis not present

## 2018-01-12 DIAGNOSIS — F339 Major depressive disorder, recurrent, unspecified: Secondary | ICD-10-CM | POA: Diagnosis not present

## 2018-01-12 DIAGNOSIS — R739 Hyperglycemia, unspecified: Secondary | ICD-10-CM

## 2018-01-12 DIAGNOSIS — R0683 Snoring: Secondary | ICD-10-CM

## 2018-01-12 DIAGNOSIS — R5383 Other fatigue: Secondary | ICD-10-CM

## 2018-01-12 DIAGNOSIS — I10 Essential (primary) hypertension: Secondary | ICD-10-CM

## 2018-01-12 DIAGNOSIS — Z23 Encounter for immunization: Secondary | ICD-10-CM | POA: Diagnosis not present

## 2018-01-12 DIAGNOSIS — M25572 Pain in left ankle and joints of left foot: Secondary | ICD-10-CM

## 2018-01-12 MED ORDER — LISINOPRIL 20 MG PO TABS: 20.0000 mg | ORAL_TABLET | Freq: Every day | ORAL | 3 refills | Status: AC

## 2018-01-12 MED ORDER — LISINOPRIL 20 MG PO TABS: 20.0000 mg | ORAL_TABLET | Freq: Every day | ORAL | 3 refills | Status: DC

## 2018-01-12 NOTE — Progress Notes (Signed)
Patient ID: Cru Kritikos, male    DOB: 1971/10/19, 47 y.o.   MRN: [id number]  Chief Complaint  Patient presents with  . Hypertension    Allergies Fish allergy; Shellfish allergy; Garlic; Onion; and Pork-derived products  Subjective:   Abigail Marsiglia is a 47 y.o. male who presents to [facility] today.  HPI Here to establish care as a new patient visit.  He reports that he has not had a primary care physician in some time.  He does have multiple complaints that he would like to discuss today.  However he did show up for his office visit over 20 minutes late.  He reported that he was very concerned about his blood pressure and requested to be seen even though he was late because he was concerned.  He reports that he has had a history of elevated blood pressure for quite some time but he has not been on any medication.  He denies any chest pain, shortness of breath, or swelling in his extremities.  He reports he has been overweight/obese for a very long time.  He does report a diet of high fat, high cholesterol, high sugar, and fast food.  He thinks his cholesterol has been elevated in the past but he is not sure.  Review of his previous lab test 3 years ago does show evidence of elevated blood sugars.  He does have a family history of diabetes but denies a personal history of diabetes.  He reports that approximately 1 year ago he had ankle surgery at [facility].  He reports that he has not been for follow-up since his surgery because he has not been able to get a ride all the way to [facility].  He would like to have an orthopedic that can take care of him locally.  He reports that the reason he ended up having surgery in [place] was that he was in [place] doing some work with a friend and he fell and broke his ankle and was taken to the hospital there.  The surgery was performed the next day due to the instability of the fracture.  He  reports that he has chronic pain in his ankle as a result of the surgery.  He did not complete any form of physical therapy or rehab for his ankle.  He reports that he was given some Percocet in the past for his ankle and it helped the pain greatly.  He is interested in this medication again.  In addition he is interested in getting a handicap permit signed for his ankle pain.   He reports that he would like to be checked for any sexually transmitted infections.  He does report that he has been sexually active and does try to use condoms.  But he is not will always been compliant with this.  He reports that he feels like his ejaculate has a foul smell.  He reports that he showers frequently but often feels like he has a body odor.  He wonders if this is due to the high cholesterol.  He also reports that he needs a referral for psychiatry.  He reports that he has disability due to his mental depression.  He reports that he became depressed and had some mental illness when his mother and his daughter passed away.  He reports that his daughter died at 40 months of age secondary to some heart issues.  He reports that he was hospitalized for over  a week in [place] secondary to his depression.  He reports that he used to be on Zoloft but is not taking that at this time.  He does not want to start any medications today for his mood.  He reports he would be interested in seeing a psychiatrist and a therapist.  He reports he actually feels like his mood is good he does not feel sad or depressed.  He denies any suicidal or homicidal ideation.  He denies any auditory or visual hallucinations.  Patient reports that he believes he has obstructive apnea.  He reports he has been told he snores by many people.  He reports that he was in the hospital once and was told that he needed to have a sleep study due to the fact that he snores.  He reports he would like to get this test done because he is concerned about his  health.     Past Medical History:  Diagnosis Date  . Ankle fracture   . Asthma   . Bronchitis   . Constipation   . GERD (gastroesophageal reflux disease)   . Hemorrhoid   . Hypertension     Past Surgical History:  Procedure Laterality Date  . ANKLE SURGERY     about one year ago at [facility]    Family History  Problem Relation Age of Onset  . Stroke Mother   . Hypertension Father   . Diabetes Father   . Breast cancer Sister   . Alcohol abuse Neg Hx   . Heart disease Neg Hx   . Hyperlipidemia Neg Hx   . Kidney disease Neg Hx      Social History   Socioeconomic History  . Marital status: Single    Spouse name: None  . Number of children: None  . Years of education: None  . Highest education level: None  Social Needs  . Financial resource strain: None  . Food insecurity - worry: None  . Food insecurity - inability: None  . Transportation needs - medical: None  . Transportation needs - non-medical: None  Occupational History  . None  Tobacco Use  . Smoking status: Never Smoker  . Smokeless tobacco: Never Used  Substance and Sexual Activity  . Alcohol use: No    Frequency: Never  . Drug use: No  . Sexual activity: Yes    Partners: Female  Other Topics Concern  . None  Social History Narrative   Lives in [place], [place].   Is on disability for depression and mental illness.Been on disability for 2 years.  Mother and daughter died and caused tremendous stress. Was being followed by psychiatrist in [place].    Single. Lives alone.     Review of Systems  Constitutional: Negative for appetite change, fatigue, fever and unexpected weight change.  HENT: Negative for dental problem, trouble swallowing and voice change.   Respiratory: Negative for chest tightness and shortness of breath.   Cardiovascular: Negative for chest pain, palpitations and leg swelling.  Gastrointestinal: Negative for diarrhea and nausea.  Genitourinary: Negative for difficulty urinating,  discharge, dysuria, flank pain, genital sores, penile swelling, scrotal swelling, testicular pain and urgency.  Musculoskeletal:       Pain in right ankle.   Skin: Negative for rash.  Neurological: Negative for syncope, weakness, light-headedness, numbness and headaches.  Hematological: Negative for adenopathy. Does not bruise/bleed easily.  Psychiatric/Behavioral: Negative for agitation, behavioral problems, confusion, decreased concentration, dysphoric mood, self-injury, sleep disturbance and suicidal ideas. The patient is  not nervous/anxious and is not hyperactive.      Objective:   BP (!) 158/92 (BP Location: Left Arm, Patient Position: Sitting, Cuff Size: Normal)   Pulse 69   Temp 98.5 F (36.9 C) (Temporal)   Resp 16   Ht 5\' 9"  (1.753 m)   Wt (!) 365 lb 12 oz (165.9 kg)   SpO2 96%   BMI 54.01 kg/m   Physical Exam  Constitutional: He is oriented to person, place, and time. He appears well-developed and well-nourished.  HENT:  Head: Normocephalic and atraumatic.  Eyes: EOM are normal. Pupils are equal, round, and reactive to light.  Neck: Normal range of motion. Neck supple. No thyromegaly present.  Cardiovascular: Normal rate, regular rhythm and normal heart sounds.  Pulses:      Dorsalis pedis pulses are 2+ on the right side, and 2+ on the left side.  Pulmonary/Chest: Effort normal and breath sounds normal.  Abdominal: Soft.  Musculoskeletal: He exhibits no edema.  Neurological: He is alert and oriented to person, place, and time. No cranial nerve deficit.  Skin: Skin is warm, dry and intact.  Psychiatric: His speech is normal. Thought content normal. His affect is labile. He is not actively hallucinating. He does not exhibit a depressed mood. He expresses no homicidal and no suicidal ideation. He expresses no suicidal plans and no homicidal plans.  Well dressed and well groomed pleasant morbidly obese male.  Mood slightly elevated over normal.  Affect consistent with  mood.  Patient overly nice and apologetic.  Speaks in a very loud voice.  Question whether patient has normal/average intellectual quotient.  Denies suicidal or homicidal ideations.  No obvious delusions, phobias, obsessions, or compulsions.  No distracting behaviors or mannerisms present or observed. He is attentive.  Vitals reviewed.    Assessment and Plan  1. Acute left ankle pain Refer to orthopedics for ankle pain.  Discussed with patient that I would not fill out his handicap permit today.  I discussed that he would need this from orthopedics saying that he could not walk longer distance.  I actually discussed with him that walking and low impact exercise would probably be beneficial to help him lose weight.  I did advise him that being obese is hard on joints and increases the risk of orthopedic issues. - Ambulatory referral to Orthopedic Surgery  2. Immunization due Vaccination given - Flu Vaccine QUAD 6+ mos PF IM (Fluarix Quad PF)  3. Essential hypertension Start lisinopril 20 mg, 1 p.o. Daily. Lifestyle modifications discussed with patient including a diet emphasizing vegetables, fruits, and whole grains. Limiting intake of sodium to less than 2,400 mg per day.  Recommendations discussed include consuming low-fat dairy products, poultry, fish, legumes, non-tropical vegetable oils, and nuts; and limiting intake of sweets, sugar-sweetened beverages, and red meat. Discussed following a plan such as the Dietary Approaches to Stop Hypertension (DASH) diet. Patient to read up on this diet.   Did discuss with patient that his salt intake and eating out could have a profound effect on his blood pressure.  We also discussed that his weight will definitely affect his blood pressure.  I did tell him I was concerned for his increased risk of diabetes secondary to his family history, weight, and blood pressure. - COMPLETE METABOLIC PANEL WITH GFR - Lipid panel - Urine Microscopic - CBC with  Differential/Platelet  4. Snoring Sleep study ordered secondary to history and body habitus - Ambulatory referral to Sleep Studies  5. Depression, recurrent (HCC)  Patient has a history of depression and psychiatric hospitalization secondary to his mood.  He does not exhibit signs of overt depression in the office today.  In actuality, his mood is actually more elevated and labile than depressed.  His mood is not with in the range of normal, however he does not pose a risk to himself or others at this time.  I do believe that psychiatric evaluation would be beneficial. - Ambulatory referral to Psychiatry  6. HTN, goal below 140/90 See above. - lisinopril (PRINIVIL,ZESTRIL) 20 MG tablet; Take 1 tablet (20 mg total) by mouth daily.  Dispense: 90 tablet; Refill: 3  7. Screen for STD (sexually transmitted disease) Screening performed secondary to request and patient's symptoms. - HIV antibody - RPR - Hepatitis panel, acute - Urine cytology ancillary only  8. Hyperglycemia Review of labs do does reveal hyperglycemia.  Patient is at risk for diabetes.  Diet, exercise, and weight loss modifications discussed with patient today. - Hemoglobin A1c  9. Fatigue, unspecified type Patient reports intermittent fatigue.  Check this is a screening test secondary to symptoms and obesity. - TSH  10. Morbid obesity (HCC) We will continue to discuss this with patient at follow-up.  Will obtain labs and possibly consider referral to nutritionist.  He did ask me for medication to help him lose weight today.  I did discuss with him that appetite depressant would not be advisable for him secondary to the fact that he does have elevated/uncontrolled blood pressure at this time.  We will discuss his weight and possible interventions and modifications to help him lose weight at follow-up visit.  Despite attempt to keep visit to a shorter timeframe due to the fact that he was 20 minutes late for her visit, office  visit was still greater than 30 minutes.  Greater than 50% of office visit spent counseling and explaining medical diagnoses, management, reasons for certain interventions at this time.  Counseled patient why I would not sign handicap permit. Return in about 4 weeks (around 02/09/2018) for follow up. Aliene Beams, MD 01/12/2018

## 2018-01-12 NOTE — Patient Instructions (Signed)
DASH Eating Plan DASH stands for "Dietary Approaches to Stop Hypertension." The DASH eating plan is a healthy eating plan that has been shown to reduce high blood pressure (hypertension). It may also reduce your risk for type 2 diabetes, heart disease, and stroke. The DASH eating plan may also help with weight loss. What are tips for following this plan? General guidelines  Avoid eating more than 2,300 mg (milligrams) of salt (sodium) a day. If you have hypertension, you may need to reduce your sodium intake to 1,500 mg a day.  Limit alcohol intake to no more than 1 drink a day for nonpregnant women and 2 drinks a day for men. One drink equals 12 oz of beer, 5 oz of wine, or 1 oz of hard liquor.  Work with your health care provider to maintain a healthy body weight or to lose weight. Ask what an ideal weight is for you.  Get at least 30 minutes of exercise that causes your heart to beat faster (aerobic exercise) most days of the week. Activities may include walking, swimming, or biking.  Work with your health care provider or diet and nutrition specialist (dietitian) to adjust your eating plan to your individual calorie needs. Reading food labels  Check food labels for the amount of sodium per serving. Choose foods with less than 5 percent of the Daily Value of sodium. Generally, foods with less than 300 mg of sodium per serving fit into this eating plan.  To find whole grains, look for the word "whole" as the first word in the ingredient list. Shopping  Buy products labeled as "low-sodium" or "no salt added."  Buy fresh foods. Avoid canned foods and premade or frozen meals. Cooking  Avoid adding salt when cooking. Use salt-free seasonings or herbs instead of table salt or sea salt. Check with your health care provider or pharmacist before using salt substitutes.  Do not fry foods. Cook foods using healthy methods such as baking, boiling, grilling, and broiling instead.  Cook with  heart-healthy oils, such as olive, canola, soybean, or sunflower oil. Meal planning   Eat a balanced diet that includes: ? 5 or more servings of fruits and vegetables each day. At each meal, try to fill half of your plate with fruits and vegetables. ? Up to 6-8 servings of whole grains each day. ? Less than 6 oz of lean meat, poultry, or fish each day. A 3-oz serving of meat is about the same size as a deck of cards. One egg equals 1 oz. ? 2 servings of low-fat dairy each day. ? A serving of nuts, seeds, or beans 5 times each week. ? Heart-healthy fats. Healthy fats called Omega-3 fatty acids are found in foods such as flaxseeds and coldwater fish, like sardines, salmon, and mackerel.  Limit how much you eat of the following: ? Canned or prepackaged foods. ? Food that is high in trans fat, such as fried foods. ? Food that is high in saturated fat, such as fatty meat. ? Sweets, desserts, sugary drinks, and other foods with added sugar. ? Full-fat dairy products.  Do not salt foods before eating.  Try to eat at least 2 vegetarian meals each week.  Eat more home-cooked food and less restaurant, buffet, and fast food.  When eating at a restaurant, ask that your food be prepared with less salt or no salt, if possible. What foods are recommended? The items listed may not be a complete list. Talk with your dietitian about what   dietary choices are best for you. Grains Whole-grain or whole-wheat bread. Whole-grain or whole-wheat pasta. Brown rice. Oatmeal. Quinoa. Bulgur. Whole-grain and low-sodium cereals. Pita bread. Low-fat, low-sodium crackers. Whole-wheat flour tortillas. Vegetables Fresh or frozen vegetables (raw, steamed, roasted, or grilled). Low-sodium or reduced-sodium tomato and vegetable juice. Low-sodium or reduced-sodium tomato sauce and tomato paste. Low-sodium or reduced-sodium canned vegetables. Fruits All fresh, dried, or frozen fruit. Canned fruit in natural juice (without  added sugar). Meat and other protein foods Skinless chicken or turkey. Ground chicken or turkey. Pork with fat trimmed off. Fish and seafood. Egg whites. Dried beans, peas, or lentils. Unsalted nuts, nut butters, and seeds. Unsalted canned beans. Lean cuts of beef with fat trimmed off. Low-sodium, lean deli meat. Dairy Low-fat (1%) or fat-free (skim) milk. Fat-free, low-fat, or reduced-fat cheeses. Nonfat, low-sodium ricotta or cottage cheese. Low-fat or nonfat yogurt. Low-fat, low-sodium cheese. Fats and oils Soft margarine without trans fats. Vegetable oil. Low-fat, reduced-fat, or light mayonnaise and salad dressings (reduced-sodium). Canola, safflower, olive, soybean, and sunflower oils. Avocado. Seasoning and other foods Herbs. Spices. Seasoning mixes without salt. Unsalted popcorn and pretzels. Fat-free sweets. What foods are not recommended? The items listed may not be a complete list. Talk with your dietitian about what dietary choices are best for you. Grains Baked goods made with fat, such as croissants, muffins, or some breads. Dry pasta or rice meal packs. Vegetables Creamed or fried vegetables. Vegetables in a cheese sauce. Regular canned vegetables (not low-sodium or reduced-sodium). Regular canned tomato sauce and paste (not low-sodium or reduced-sodium). Regular tomato and vegetable juice (not low-sodium or reduced-sodium). Pickles. Olives. Fruits Canned fruit in a light or heavy syrup. Fried fruit. Fruit in cream or butter sauce. Meat and other protein foods Fatty cuts of meat. Ribs. Fried meat. Bacon. Sausage. Bologna and other processed lunch meats. Salami. Fatback. Hotdogs. Bratwurst. Salted nuts and seeds. Canned beans with added salt. Canned or smoked fish. Whole eggs or egg yolks. Chicken or turkey with skin. Dairy Whole or 2% milk, cream, and half-and-half. Whole or full-fat cream cheese. Whole-fat or sweetened yogurt. Full-fat cheese. Nondairy creamers. Whipped toppings.  Processed cheese and cheese spreads. Fats and oils Butter. Stick margarine. Lard. Shortening. Ghee. Bacon fat. Tropical oils, such as coconut, palm kernel, or palm oil. Seasoning and other foods Salted popcorn and pretzels. Onion salt, garlic salt, seasoned salt, table salt, and sea salt. Worcestershire sauce. Tartar sauce. Barbecue sauce. Teriyaki sauce. Soy sauce, including reduced-sodium. Steak sauce. Canned and packaged gravies. Fish sauce. Oyster sauce. Cocktail sauce. Horseradish that you find on the shelf. Ketchup. Mustard. Meat flavorings and tenderizers. Bouillon cubes. Hot sauce and Tabasco sauce. Premade or packaged marinades. Premade or packaged taco seasonings. Relishes. Regular salad dressings. Where to find more information:  National Heart, Lung, and Blood Institute: www.nhlbi.nih.gov  American Heart Association: www.heart.org Summary  The DASH eating plan is a healthy eating plan that has been shown to reduce high blood pressure (hypertension). It may also reduce your risk for type 2 diabetes, heart disease, and stroke.  With the DASH eating plan, you should limit salt (sodium) intake to 2,300 mg a day. If you have hypertension, you may need to reduce your sodium intake to 1,500 mg a day.  When on the DASH eating plan, aim to eat more fresh fruits and vegetables, whole grains, lean proteins, low-fat dairy, and heart-healthy fats.  Work with your health care provider or diet and nutrition specialist (dietitian) to adjust your eating plan to your individual   calorie needs. This information is not intended to replace advice given to you by your health care provider. Make sure you discuss any questions you have with your health care provider. Document Released: 10/28/2011 Document Revised: 11/01/2016 Document Reviewed: 11/01/2016 Elsevier Interactive Patient Education  2018 [employer] Obesity, Adult Obesity is having too much body fat. If you have a BMI of 30 or more, you are  obese. BMI is a number that explains how much body fat you have. Obesity is often caused by taking in (consuming) more calories than your body uses. Obesity can cause serious health problems. Changing your lifestyle can help to treat obesity. Follow these instructions at home: Eating and drinking   Follow advice from your doctor about what to eat and drink. Your doctor may tell you to: ? Cut down on (limit) fast foods, sweets, and processed snack foods. ? Choose low-fat options. For example, choose low-fat milk instead of whole milk. ? Eat 5 or more servings of fruits or vegetables every day. ? Eat at home more often. This gives you more control over what you eat. ? Choose healthy foods when you eat out. ? Learn what a healthy portion size is. A portion size is the amount of a certain food that is healthy for you to eat at one time. This is different for each person. ? Keep low-fat snacks available. ? Avoid sugary drinks. These include soda, fruit juice, iced tea that is sweetened with sugar, and flavored milk. ? Eat a healthy breakfast.  Drink enough water to keep your pee (urine) clear or pale yellow.  Do not go without eating for long periods of time (do not fast).  Do not go on popular or trendy diets (fad diets). Physical Activity  Exercise often, as told by your doctor. Ask your doctor: ? What types of exercise are safe for you. ? How often you should exercise.  Warm up and stretch before being active.  Do slow stretching after being active (cool down).  Rest between times of being active. Lifestyle  Limit how much time you spend in front of your TV, computer, or video game system (be less sedentary).  Find ways to reward yourself that do not involve food.  Limit alcohol intake to no more than 1 drink a day for nonpregnant women and 2 drinks a day for men. One drink equals 12 oz of beer, 5 oz of wine, or 1 oz of hard liquor. General instructions  Keep a weight loss  journal. This can help you keep track of: ? The food that you eat. ? The exercise that you do.  Take over-the-counter and prescription medicines only as told by your doctor.  Take vitamins and supplements only as told by your doctor.  Think about joining a support group. Your doctor may be able to help with this.  Keep all follow-up visits as told by your doctor. This is important. Contact a doctor if:  You cannot meet your weight loss goal after you have changed your diet and lifestyle for 6 weeks. This information is not intended to replace advice given to you by your health care provider. Make sure you discuss any questions you have with your health care provider. Document Released: 01/31/2012 Document Revised: 04/15/2016 Document Reviewed: 08/27/2015 Elsevier Interactive Patient Education  2018 [employer]

## 2018-01-13 ENCOUNTER — Encounter: Payer: Self-pay | Admitting: Family Medicine

## 2018-01-13 ENCOUNTER — Telehealth: Payer: Self-pay | Admitting: Family Medicine

## 2018-01-13 DIAGNOSIS — R0683 Snoring: Secondary | ICD-10-CM

## 2018-01-13 LAB — COMPLETE METABOLIC PANEL WITH GFR
AG Ratio: 1.2 (calc) (ref 1.0–2.5)
ALKALINE PHOSPHATASE (APISO): 80 U/L (ref 40–115)
ALT: 17 U/L (ref 9–46)
AST: 15 U/L (ref 10–40)
Albumin: 4.1 g/dL (ref 3.6–5.1)
BUN: 8 mg/dL (ref 7–25)
CALCIUM: 9.4 mg/dL (ref 8.6–10.3)
CO2: 26 mmol/L (ref 20–32)
CREATININE: 1.01 mg/dL (ref 0.60–1.35)
Chloride: 107 mmol/L (ref 98–110)
GFR, Est African American: 103 mL/min/{1.73_m2} (ref >=60)
GFR, Est Non African American: 89 mL/min/{1.73_m2} (ref >=60)
GLUCOSE: 135 mg/dL — AB (ref 65–99)
Globulin: 3.4 g/dL (calc) (ref 1.9–3.7)
Potassium: 4.2 mmol/L (ref 3.5–5.3)
SODIUM: 140 mmol/L (ref 135–146)
Total Bilirubin: 0.6 mg/dL (ref 0.2–1.2)
Total Protein: 7.5 g/dL (ref 6.1–8.1)

## 2018-01-13 LAB — URINALYSIS, MICROSCOPIC ONLY
Bacteria, UA: NONE SEEN /HPF
Hyaline Cast: NONE SEEN /LPF
Squamous Epithelial / LPF: NONE SEEN /HPF (ref <=5)
WBC, UA: NONE SEEN /HPF (ref 0–5)

## 2018-01-13 LAB — CBC WITH DIFFERENTIAL/PLATELET
BASOS PCT: 1 %
Basophils Absolute: 68 cells/uL (ref 0–200)
Eosinophils Absolute: 122 cells/uL (ref 15–500)
Eosinophils Relative: 1.8 %
HCT: 42.6 % (ref 38.5–50.0)
HEMOGLOBIN: 14.9 g/dL (ref 13.2–17.1)
Lymphs Abs: 1673 cells/uL (ref 850–3900)
MCH: 30 pg (ref 27.0–33.0)
MCHC: 35 g/dL (ref 32.0–36.0)
MCV: 85.7 fL (ref 80.0–100.0)
MONOS PCT: 7.8 %
MPV: 9.9 fL (ref 7.5–12.5)
NEUTROS ABS: 4406 {cells}/uL (ref 1500–7800)
Neutrophils Relative %: 64.8 %
PLATELETS: 340 10*3/uL (ref 140–400)
RBC: 4.97 10*6/uL (ref 4.20–5.80)
RDW: 11.7 % (ref 11.0–15.0)
TOTAL LYMPHOCYTE: 24.6 %
WBC: 6.8 10*3/uL (ref 3.8–10.8)
WBCMIX: 530 {cells}/uL (ref 200–950)

## 2018-01-13 LAB — LIPID PANEL
CHOL/HDL RATIO: 5.6 (calc) — AB (ref <5.0)
CHOLESTEROL: 219 mg/dL — AB (ref <200)
HDL: 39 mg/dL — AB (ref >40)
LDL CHOLESTEROL (CALC): 150 mg/dL — AB
Non-HDL Cholesterol (Calc): 180 mg/dL (calc) — ABNORMAL HIGH (ref <130)
TRIGLYCERIDES: 160 mg/dL — AB (ref <150)

## 2018-01-13 LAB — HEMOGLOBIN A1C
Hgb A1c MFr Bld: 5 % of total Hgb (ref <5.7)
Mean Plasma Glucose: 97 (calc)
eAG (mmol/L): 5.4 (calc)

## 2018-01-13 LAB — TSH: TSH: 1.09 mIU/L (ref 0.40–4.50)

## 2018-01-13 LAB — HEPATITIS PANEL, ACUTE
HEP A IGM: NONREACTIVE
HEP B C IGM: NONREACTIVE
HEP C AB: NONREACTIVE
Hepatitis B Surface Ag: NONREACTIVE
SIGNAL TO CUT-OFF: 0.14 (ref <1.00)

## 2018-01-13 LAB — HIV ANTIBODY (ROUTINE TESTING W REFLEX): HIV 1&2 Ab, 4th Generation: NONREACTIVE

## 2018-01-13 LAB — RPR: RPR: NONREACTIVE

## 2018-01-13 NOTE — Telephone Encounter (Signed)
Order corrected

## 2018-01-16 ENCOUNTER — Telehealth (HOSPITAL_COMMUNITY): Payer: Self-pay

## 2018-01-16 NOTE — Telephone Encounter (Signed)
Referred by Dr. Vanetta ShawlHisada from the [facility]. [facility].  Patient reports  that he wants to come into the office and speak to a psychiatrist.  [facility]  He is scheduled to meet with Dr. Vanetta ShawlHisada on 02-14-2018.  [facility]  Decreased Interest 0  Down, Depressed, Hopeless 0  PHQ - 2 Score 0

## 2018-01-19 ENCOUNTER — Ambulatory Visit (INDEPENDENT_AMBULATORY_CARE_PROVIDER_SITE_OTHER): Payer: Medicare Other | Admitting: Orthopaedic Surgery

## 2018-01-19 ENCOUNTER — Ambulatory Visit (INDEPENDENT_AMBULATORY_CARE_PROVIDER_SITE_OTHER): Payer: Self-pay | Admitting: Orthopaedic Surgery

## 2018-01-26 ENCOUNTER — Ambulatory Visit (INDEPENDENT_AMBULATORY_CARE_PROVIDER_SITE_OTHER): Payer: Medicare Other

## 2018-01-26 ENCOUNTER — Ambulatory Visit (INDEPENDENT_AMBULATORY_CARE_PROVIDER_SITE_OTHER): Payer: Medicare Other | Admitting: Orthopaedic Surgery

## 2018-01-26 ENCOUNTER — Encounter (INDEPENDENT_AMBULATORY_CARE_PROVIDER_SITE_OTHER): Payer: Self-pay | Admitting: Orthopaedic Surgery

## 2018-01-26 VITALS — BP 140/92 | HR 96 | Ht 70.0 in | Wt 365.0 lb

## 2018-01-26 DIAGNOSIS — M25572 Pain in left ankle and joints of left foot: Secondary | ICD-10-CM

## 2018-01-26 MED ORDER — BUPIVACAINE HCL 0.5 % IJ SOLN
3.0000 mL | INTRAMUSCULAR | Status: AC | PRN
Start: 2018-01-26 — End: 2018-01-26
  Administered 2018-01-26: 3 mL via INTRA_ARTICULAR

## 2018-01-26 MED ORDER — METHYLPREDNISOLONE ACETATE 40 MG/ML IJ SUSP
40.0000 mg | INTRAMUSCULAR | Status: AC | PRN
  Administered 2018-01-26: 40 mg via INTRA_ARTICULAR

## 2018-01-26 MED ORDER — LIDOCAINE HCL 1 % IJ SOLN
0.5000 mL | INTRAMUSCULAR | Status: AC | PRN
  Administered 2018-01-26: .5 mL

## 2018-01-26 NOTE — Progress Notes (Signed)
Office Visit Note   Patient: Thomas Holloway           Date of Birth: 05/18/1971           MRN: [id number] Visit Date: 01/26/2018              Requested by: Etta GrandchildJones, Thomas L, MD [address] [place], [place] [id number] PCP: Etta GrandchildJones, Thomas L, MD   Assessment & Plan: Visit Diagnoses:  1. Pain in left ankle and joints of left foot   Full ankle post-ORIF fibula.  Patient's been out on disability since his injury.  He has had persistent constant pain.  Plan: Injection patient got about 50% pain relief.  He could walk on his toes and also slightly on his heel after the injection.  I will check him back again in 6 weeks if he still having problems I would consider an MRI scan.  It does not look like he has a fibular nonunion.  He may have some ankle synovitis or possibly some posterior tibial tendinopathy with his medial ankle pain.  It appears that he had a deltoid ligament evulsion injury since there is some calcification consistent with healing.  Recheck 6 weeks.  Thank you for the opportunity to see him in consultation.  Follow-Up Instructions: No Follow-up on file.   Orders:  Orders Placed This Encounter  Procedures  . XR Ankle Complete Left   No orders of the defined types were placed in this encounter.     Procedures: Medium Joint Inj on 01/26/2018 3:29 PM Indications: pain Details: 22 G 1.5 in needle, anteromedial approach Medications: 3 mL bupivacaine 0.5 %; 0.5 mL lidocaine 1 %; 40 mg methylPREDNISolone acetate 40 MG/ML Outcome: tolerated well, no immediate complications Procedure, treatment alternatives, risks and benefits explained, specific risks discussed. Consent was given by the patient. Immediately prior to procedure a time out was called to verify the correct patient, procedure, equipment, support staff and site/side marked as required. Patient was prepped and draped in the usual sterile fashion.       Clinical Data: No additional  findings.   Subjective: Chief Complaint  Patient presents with  . Left Ankle - Pain    HPI 47 year old male here for consultation at the request of [place] primary care with left ankle injury December 2017 treated [facility].  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  He states he has been ambulating with a limp he is use an ASO also walker with wheels ice heat, heel pads, anti-inflammatories.  He was on oxycodone 10 in the past.  He is also taking lisinopril for blood pressure.  Review of Systems positive for previous ankle surgery.  Hyperhidrosis.  History of tinea cruris.  Otherwise negative as it pertains HPI.  Other than the 2017 fracture he denies any other injuries to his ankles.   Objective: Vital Signs: BP (!) 140/92   Pulse 96   Ht 5\' 10"  (1.778 m)   Wt (!) 365 lb (165.6 kg)   BMI 52.37 kg/m   Physical Exam  Constitutional: He is oriented to person, place, and time. He appears well-developed and well-nourished.  HENT:  Head: Normocephalic and atraumatic.  Eyes: EOM are normal. Pupils are equal, round, and reactive to light.  Neck: No tracheal deviation present. No thyromegaly present.  Cardiovascular: Normal rate.  Pulmonary/Chest: Effort normal. He has no wheezes.  Abdominal: Soft. Bowel sounds are  normal.  Neurological: He is alert and oriented to person, place, and time.  Skin: Skin is warm and dry. Capillary refill takes less than 2 seconds.  Psychiatric: He has a normal mood and affect. His behavior is normal. Judgment and thought content normal.    Ortho Exam patient walks with a pronounced left ankle limp.  He complains of pain with dorsiflexion plantarflexion.  Well-healed lateral incision over the lateral malleolus minimally tender.  Plate and screws are not palpable or irritated.  No cellulitis.  He has tenderness palpation over the medial deltoid ligament and anterior medial ankle joint.   Posterior tibial tendon is tender.  Dorsalis pedis posterior tibial pulses healed.  He has some ankle arthroscopic portals as well as a lateral healed incision.  Opposite ankle has good range of motion.  Knees reach full extension.  Specialty Comments:  No specialty comments available.  Imaging: No results found.   PMFS History: Patient Active Problem List   Diagnosis Date Noted  . Routine general medical examination at a health care facility 07/05/2012  . Hyperhidrosis 07/05/2012  . Blood in stool 07/05/2012  . Need for Tdap vaccination 07/05/2012  . Tinea cruris 07/05/2012   Past Medical History:  Diagnosis Date  . Ankle fracture   . Asthma   . Bronchitis   . Constipation   . GERD (gastroesophageal reflux disease)   . Hemorrhoid   . Hypertension     Family History  Problem Relation Age of Onset  . Stroke Mother   . Hypertension Father   . Diabetes Father   . Breast cancer Sister   . Alcohol abuse Neg Hx   . Heart disease Neg Hx   . Hyperlipidemia Neg Hx   . Kidney disease Neg Hx     Past Surgical History:  Procedure Laterality Date  . ANKLE SURGERY     about one year ago at [facility]   Social History   Occupational History  . Not on file  Tobacco Use  . Smoking status: Never Smoker  . Smokeless tobacco: Never Used  Substance and Sexual Activity  . Alcohol use: No    Frequency: Never  . Drug use: No  . Sexual activity: Yes    Partners: Female

## 2018-02-01 ENCOUNTER — Other Ambulatory Visit: Payer: Self-pay

## 2018-02-01 ENCOUNTER — Emergency Department (HOSPITAL_COMMUNITY): Payer: Medicare Other

## 2018-02-01 ENCOUNTER — Encounter (HOSPITAL_COMMUNITY): Payer: Self-pay | Admitting: *Deleted

## 2018-02-01 ENCOUNTER — Emergency Department (HOSPITAL_COMMUNITY)
Admission: EM | Admit: 2018-02-01 | Discharge: 2018-02-02 | Disposition: A | Discharge status: Home / Self Care | Payer: Medicare Other | Attending: Emergency Medicine | Admitting: Emergency Medicine

## 2018-02-01 DIAGNOSIS — W2209XA Striking against other stationary object, initial encounter: Secondary | ICD-10-CM | POA: Diagnosis not present

## 2018-02-01 DIAGNOSIS — S9031XA Contusion of right foot, initial encounter: Secondary | ICD-10-CM | POA: Insufficient documentation

## 2018-02-01 DIAGNOSIS — K625 Hemorrhage of anus and rectum: Secondary | ICD-10-CM | POA: Insufficient documentation

## 2018-02-01 DIAGNOSIS — J45909 Unspecified asthma, uncomplicated: Secondary | ICD-10-CM | POA: Insufficient documentation

## 2018-02-01 DIAGNOSIS — Y9389 Activity, other specified: Secondary | ICD-10-CM | POA: Diagnosis not present

## 2018-02-01 DIAGNOSIS — Y999 Unspecified external cause status: Secondary | ICD-10-CM | POA: Insufficient documentation

## 2018-02-01 DIAGNOSIS — S99921A Unspecified injury of right foot, initial encounter: Secondary | ICD-10-CM | POA: Diagnosis present

## 2018-02-01 DIAGNOSIS — Y929 Unspecified place or not applicable: Secondary | ICD-10-CM | POA: Diagnosis not present

## 2018-02-01 DIAGNOSIS — I10 Essential (primary) hypertension: Secondary | ICD-10-CM | POA: Diagnosis not present

## 2018-02-01 LAB — CBC WITH DIFFERENTIAL/PLATELET
Basophils Absolute: 0 10*3/uL (ref 0.0–0.1)
Basophils Relative: 0 %
EOS PCT: 1 %
Eosinophils Absolute: 0.1 10*3/uL (ref 0.0–0.7)
HCT: 44.4 % (ref 39.0–52.0)
Hemoglobin: 14.1 g/dL (ref 13.0–17.0)
LYMPHS ABS: 2.2 10*3/uL (ref 0.7–4.0)
LYMPHS PCT: 20 %
MCH: 29.2 pg (ref 26.0–34.0)
MCHC: 31.8 g/dL (ref 30.0–36.0)
MCV: 91.9 fL (ref 78.0–100.0)
MONO ABS: 0.6 10*3/uL (ref 0.1–1.0)
MONOS PCT: 5 %
Neutro Abs: 8.1 10*3/uL — ABNORMAL HIGH (ref 1.7–7.7)
Neutrophils Relative %: 74 %
PLATELETS: 361 10*3/uL (ref 150–400)
RBC: 4.83 MIL/uL (ref 4.22–5.81)
RDW: 12.4 % (ref 11.5–15.5)
WBC: 11 10*3/uL — ABNORMAL HIGH (ref 4.0–10.5)

## 2018-02-01 LAB — BASIC METABOLIC PANEL
Anion gap: 9 (ref 5–15)
BUN: 15 mg/dL (ref 6–20)
CALCIUM: 9.6 mg/dL (ref 8.9–10.3)
CO2: 27 mmol/L (ref 22–32)
CREATININE: 1.25 mg/dL — AB (ref 0.61–1.24)
Chloride: 107 mmol/L (ref 101–111)
GFR calc Af Amer: >60 mL/min (ref >60)
GFR calc non Af Amer: >60 mL/min (ref >60)
GLUCOSE: 133 mg/dL — AB (ref 65–99)
POTASSIUM: 3.9 mmol/L (ref 3.5–5.1)
Sodium: 143 mmol/L (ref 135–145)

## 2018-02-01 LAB — POC OCCULT BLOOD, ED: Fecal Occult Bld: POSITIVE — AB

## 2018-02-01 LAB — SAMPLE TO BLOOD BANK

## 2018-02-01 MED ORDER — SODIUM CHLORIDE 0.9 % IV BOLUS (SEPSIS)
1000.0000 mL | Freq: Once | INTRAVENOUS | Status: AC
  Administered 2018-02-02: 1000 mL via INTRAVENOUS

## 2018-02-01 NOTE — ED Triage Notes (Signed)
Pt c/o right foot swelling that started today with rectal bleeding that started a couple of days ago

## 2018-02-01 NOTE — ED Provider Notes (Signed)
[facility] EMERGENCY DEPARTMENT Provider Note   CSN: [id number] Arrival date & time: 02/01/18  1950     History   Chief Complaint Chief Complaint  Patient presents with  . Leg Swelling  . Rectal Bleeding    HPI Thomas Holloway is a 47 y.o. male.  Patient presents with right foot pain after hitting it on the edge of a step this evening.  States he was wearing a shoe trying to go up the steps when the dorsal foot hit the edge of the step.  States he was feeling fine before this.  Denies any pain or swelling to the rest of his leg. Patient also complains of rectal bleeding that onset 2 days ago.  States he is has to be straining on the toilet and has dark stools.  When he wipes he sees bright red blood in the toilet paper.  Toilet bowl did not turn red.  Has not had any issues with bleeding other than with bowel movements.  Denies abdominal pain, fever or vomiting.  Does not take any blood thinners.  States he has had a hemorrhoid in the past and believes this is the case again.  Denies any dizziness or lightheadedness.  Denies any chest pain or shortness of breath.   The history is provided by the patient.  Rectal Bleeding  Associated symptoms: no abdominal pain, no dizziness, no epistaxis, no fever and no vomiting     Past Medical History:  Diagnosis Date  . Ankle fracture   . Asthma   . Bronchitis   . Constipation   . GERD (gastroesophageal reflux disease)   . Hemorrhoid   . Hypertension     Patient Active Problem List   Diagnosis Date Noted  . Routine general medical examination at a health care facility 07/05/2012  . Hyperhidrosis 07/05/2012  . Blood in stool 07/05/2012  . Need for Tdap vaccination 07/05/2012  . Tinea cruris 07/05/2012    Past Surgical History:  Procedure Laterality Date  . ANKLE SURGERY     about one year ago at [facility] Medications    Prior to Admission medications   Medication Sig Start Date End Date Taking? Authorizing Provider    albuterol (PROVENTIL HFA;VENTOLIN HFA) 108 (90 BASE) MCG/ACT inhaler Inhale 2 puffs into the lungs every 6 (six) hours as needed for wheezing or shortness of breath.   Yes [provider]  lisinopril (PRINIVIL,ZESTRIL) 20 MG tablet Take 1 tablet (20 mg total) by mouth daily. Patient taking differently: Take 20 mg by mouth daily as needed.  01/12/18  Yes Hagler, Fleet Contras, MD  hydrocortisone 2.5 % cream Apply topically 2 (two) times daily. Patient not taking: Reported on 01/12/2018 11/18/17   Mancel Bale, MD    Family History Family History  Problem Relation Age of Onset  . Stroke Mother   . Hypertension Father   . Diabetes Father   . Breast cancer Sister   . Alcohol abuse Neg Hx   . Heart disease Neg Hx   . Hyperlipidemia Neg Hx   . Kidney disease Neg Hx     Social History Social History   Tobacco Use  . Smoking status: Never Smoker  . Smokeless tobacco: Never Used  Substance Use Topics  . Alcohol use: No    Frequency: Never  . Drug use: No     Allergies   Fish allergy; Shellfish allergy; Garlic; Onion; and Pork-derived products   Review of Systems Review of  Systems  Constitutional: Negative for activity change, appetite change and fever.  HENT: Negative for congestion, facial swelling, nosebleeds and sore throat.   Respiratory: Negative for cough, chest tightness and shortness of breath.   Cardiovascular: Negative for chest pain.  Gastrointestinal: Positive for anal bleeding, blood in stool and hematochezia. Negative for abdominal pain, nausea and vomiting.  Genitourinary: Negative for dysuria, hematuria, penile swelling and urgency.  Musculoskeletal: Positive for arthralgias and myalgias. Negative for joint swelling.  Skin: Negative for rash.  Neurological: Negative for dizziness, weakness and headaches.    all other systems are negative except as noted in the HPI and PMH.    Physical Exam Updated Vital Signs BP (!) 144/72 (BP Location: Right Arm)    Pulse (!) 109   Temp 98.9 F (37.2 C) (Oral)   Resp 20   Ht 5\' 9"  (1.753 m)   Wt (!) 165.6 kg (365 lb)   SpO2 98%   BMI 53.90 kg/m   Physical Exam  Constitutional: He is oriented to person, place, and time. He appears well-developed and well-nourished. No distress.  HENT:  Head: Normocephalic and atraumatic.  Mouth/Throat: Oropharynx is clear and moist. No oropharyngeal exudate.  Eyes: Conjunctivae and EOM are normal. Pupils are equal, round, and reactive to light.  Neck: Normal range of motion. Neck supple.  No meningismus.  Cardiovascular: Normal rate, regular rhythm, normal heart sounds and intact distal pulses.  No murmur heard. Pulmonary/Chest: Effort normal and breath sounds normal. No respiratory distress.  Abdominal: Soft. There is no tenderness. There is no rebound and no guarding.  Genitourinary:  Genitourinary Comments: There is a skin tag versus hemorrhoid at the 3 o'clock position with some overlying abrasion and bleeding.  No areas of fluctuance or erythema. No gross blood. No fissures.  Musculoskeletal: Normal range of motion. He exhibits edema and tenderness.  Diffuse tenderness to the dorsal right foot without deformity.  Intact DP and PT pulses.  No ankle tenderness.  Achilles tendon is intact.  Neurological: He is alert and oriented to person, place, and time. No cranial nerve deficit. He exhibits normal muscle tone. Coordination normal.  No ataxia on finger to nose bilaterally. No pronator drift. 5/5 strength throughout. CN 2-12 intact.Equal grip strength. Sensation intact.   Skin: Skin is warm.  Psychiatric: He has a normal mood and affect. His behavior is normal.  Nursing note and vitals reviewed.    ED Treatments / Results  Labs (all labs ordered are listed, but only abnormal results are displayed) Labs Reviewed  CBC WITH DIFFERENTIAL/PLATELET - Abnormal; Notable for the following components:      Result Value   WBC 11.0 (*)    Neutro Abs 8.1 (*)     All other components within normal limits  BASIC METABOLIC PANEL - Abnormal; Notable for the following components:   Glucose, Bld 133 (*)    Creatinine, Ser 1.25 (*)    All other components within normal limits  POC OCCULT BLOOD, ED - Abnormal; Notable for the following components:   Fecal Occult Bld POSITIVE (*)    All other components within normal limits  SAMPLE TO BLOOD BANK    EKG  EKG Interpretation None       Radiology Dg Foot Complete Right  Result Date: 02/01/2018 CLINICAL DATA:  Right foot pain EXAM: RIGHT FOOT COMPLETE - 3+ VIEW COMPARISON:  None. FINDINGS: No acute bony abnormality. Specifically, no fracture, subluxation, or dislocation. Secondary ossification center of noted along the medial proximal navicular border.  Joint spaces are maintained. Small plantar calcaneal spur. IMPRESSION: No acute bony abnormality. Electronically Signed   By: Charlett NoseKevin  Dover M.D.   On: 02/01/2018 21:58    Procedures Procedures (including critical care time)  Medications Ordered in ED Medications  sodium chloride 0.9 % bolus 1,000 mL (not administered)     Initial Impression / Assessment and Plan / ED Course  I have reviewed the triage vital signs and the nursing notes.  Pertinent labs & imaging results that were available during my care of the patient were reviewed by me and considered in my medical decision making (see chart for details).     Patient with right foot pain after injuring it earlier tonight.  Neurovascularly intact with negative x-ray.  Rectal bleeding with bowel movements.  There is a hemorrhoid versus skin tag with abrasion.  Hemoglobin is stable.  Vitals are stable.  Heart rate did increase with standing but blood pressure was stable.  IV fluids given.  No evidence of significant ongoing GI bleed.  Patient with no abdominal pain or rectal pain.  Suspect from anal lesion  Topical steroids and GI follow-up.  Return precautions discussed. HR improved to 95 on  recheck.  BP 134/86 (BP Location: Left Arm)   Pulse 94   Temp 98.7 F (37.1 C) (Oral)   Resp 18   Ht 5\' 9"  (1.753 m)   Wt (!) 165.6 kg (365 lb)   SpO2 100%   BMI 53.90 kg/m    Final Clinical Impressions(s) / ED Diagnoses   Final diagnoses:  Rectal bleeding  Contusion of right foot, initial encounter    ED Discharge Orders    None       Nava Song, Jeannett SeniorStephen, MD 02/02/18 [phone]

## 2018-02-02 DIAGNOSIS — S9031XA Contusion of right foot, initial encounter: Secondary | ICD-10-CM | POA: Diagnosis not present

## 2018-02-02 MED ORDER — HYDROCORTISONE 2.5 % RE CREA: TOPICAL_CREAM | RECTAL | 0 refills | Status: AC

## 2018-02-02 NOTE — Discharge Instructions (Signed)
Your bleeding appears to be coming from a hemorrhoid or skin tag outside of your anus.  Follow-up with your doctor in the stomach doctor to make sure this improves.  It may require further intervention if it does not get better on its own.  Try to avoid alcohol, NSAIDs, caffeine.  Follow-up with your doctor.  Return to the ED with new or worsening symptoms.

## 2018-02-09 NOTE — Progress Notes (Deleted)
Psychiatric Initial Adult Assessment   Patient Identification: Thomas Holloway MRN:  [id number] Date of Evaluation:  02/09/2018 Referral Source: Aliene Beams, MD Chief Complaint:   Visit Diagnosis: No diagnosis found.  History of Present Illness:   Thomas Holloway is a 47 y.o. year old male with a history of depression, hypertension, obesity, asthma, GERD , who is referred for depression.    Was on sertraline, addmitted before  Associated Signs/Symptoms: Depression Symptoms:  {DEPRESSION SYMPTOMS:20000} (Hypo) Manic Symptoms:  {BHH MANIC SYMPTOMS:22872} Anxiety Symptoms:  {BHH ANXIETY SYMPTOMS:22873} Psychotic Symptoms:  {BHH PSYCHOTIC SYMPTOMS:22874} PTSD Symptoms: {BHH PTSD SYMPTOMS:22875}  Past Psychiatric History:  Outpatient:  Psychiatry admission:  Previous suicide attempt:  Past trials of medication:  History of violence:   Previous Psychotropic Medications: {YES/NO:21197}  Substance Abuse History in the last 12 months:  {yes no:314532}  Consequences of Substance Abuse: {BHH CONSEQUENCES OF SUBSTANCE ABUSE:22880}  Past Medical History:  Past Medical History:  Diagnosis Date  . Ankle fracture   . Asthma   . Bronchitis   . Constipation   . GERD (gastroesophageal reflux disease)   . Hemorrhoid   . Hypertension     Past Surgical History:  Procedure Laterality Date  . ANKLE SURGERY     about one year ago at [facility]    Family Psychiatric History: ***  Family History:  Family History  Problem Relation Age of Onset  . Stroke Mother   . Hypertension Father   . Diabetes Father   . Breast cancer Sister   . Alcohol abuse Neg Hx   . Heart disease Neg Hx   . Hyperlipidemia Neg Hx   . Kidney disease Neg Hx     Social History:   Social History   Socioeconomic History  . Marital status: Single    Spouse name: Not on file  . Number of children: Not on file  . Years of education: Not on file  . Highest education level: Not on file  Occupational History  .  Not on file  Social Needs  . Financial resource strain: Not on file  . Food insecurity:    Worry: Not on file    Inability: Not on file  . Transportation needs:    Medical: Not on file    Non-medical: Not on file  Tobacco Use  . Smoking status: Never Smoker  . Smokeless tobacco: Never Used  Substance and Sexual Activity  . Alcohol use: No    Frequency: Never  . Drug use: No  . Sexual activity: Yes    Partners: Female  Lifestyle  . Physical activity:    Days per week: Not on file    Minutes per session: Not on file  . Stress: Not on file  Relationships  . Social connections:    Talks on phone: Not on file    Gets together: Not on file    Attends religious service: Not on file    Active member of club or organization: Not on file    Attends meetings of clubs or organizations: Not on file    Relationship status: Not on file  Other Topics Concern  . Not on file  Social History Narrative   Lives in [place], [place].   Is on disability for depression and mental illness.Been on disability for 2 years.  Mother and daughter died and caused tremendous stress. Was being followed by psychiatrist in [place].    Single. Lives alone.     Additional Social History: ***  Allergies:  Allergies  Allergen Reactions  . Fish Allergy Itching and Swelling  . Shellfish Allergy Itching and Swelling    Sneezing and watery eyes, some itching and swelling of tongue  . Garlic   . Onion Swelling  . Pork-Derived Products     Metabolic Disorder Labs: Lab Results  Component Value Date   HGBA1C 5.0 01/12/2018   MPG 97 01/12/2018   No results found for: PROLACTIN Lab Results  Component Value Date   CHOL 219 (H) 01/12/2018   TRIG 160 (H) 01/12/2018   HDL 39 (L) 01/12/2018   CHOLHDL 5.6 (H) 01/12/2018   VLDL 14.2 07/05/2012   LDLCALC 150 (H) 01/12/2018   LDLCALC 125 (H) 07/05/2012     Current Medications: Current Outpatient Medications  Medication Sig Dispense Refill  . albuterol  (PROVENTIL HFA;VENTOLIN HFA) 108 (90 BASE) MCG/ACT inhaler Inhale 2 puffs into the lungs every 6 (six) hours as needed for wheezing or shortness of breath.    . hydrocortisone (ANUSOL-HC) 2.5 % rectal cream Apply rectally 2 times daily 28.35 g 0  . hydrocortisone 2.5 % cream Apply topically 2 (two) times daily. (Patient not taking: Reported on 01/12/2018) 30 g 0  . lisinopril (PRINIVIL,ZESTRIL) 20 MG tablet Take 1 tablet (20 mg total) by mouth daily. (Patient taking differently: Take 20 mg by mouth daily as needed. ) 90 tablet 3   No current facility-administered medications for this visit.     Neurologic: Headache: No Seizure: No Paresthesias:No  Musculoskeletal: Strength & Muscle Tone: within normal limits Gait & Station: normal Patient leans: N/A  Psychiatric Specialty Exam: ROS  There were no vitals taken for this visit.There is no height or weight on file to calculate BMI.  General Appearance: Fairly Groomed  Eye Contact:  Good  Speech:  Clear and Coherent  Volume:  Normal  Mood:  {BHH MOOD:22306}  Affect:  {Affect (PAA):22687}  Thought Process:  Coherent and Goal Directed  Orientation:  Full (Time, Place, and Person)  Thought Content:  Logical  Suicidal Thoughts:  {ST/HT (PAA):22692}  Homicidal Thoughts:  {ST/HT (PAA):22692}  Memory:  Immediate;   Good Recent;   Good Remote;   Good  Judgement:  {Judgement (PAA):22694}  Insight:  {Insight (PAA):22695}  Psychomotor Activity:  Normal  Concentration:  Concentration: Good and Attention Span: Good  Recall:  Good  Fund of Knowledge:Good  Language: Good  Akathisia:  No  Handed:  Right  AIMS (if indicated):  N/A  Assets:  Communication Skills Desire for Improvement  ADL's:  Intact  Cognition: WNL  Sleep:  ***   Assessment  Plan  The patient demonstrates the following risk factors for suicide: Chronic risk factors for suicide include: {Chronic Risk Factors for ZOXWRUE:45409811}Suicide:30414011}. Acute risk factors for suicide  include: {Acute Risk Factors for BJYNWGN:56213086}Suicide:30414012}. Protective factors for this patient include: {Protective Factors for Suicide VHQI:69629528}Risk:30414013}. Considering these factors, the overall suicide risk at this point appears to be {Desc; low/moderate/high:110033}. Patient {ACTION; IS/IS UXL:24401027}OT:21021397} appropriate for outpatient follow up.   Treatment Plan Summary: Plan as above   Neysa Hottereina Gizella [place], MD 3/21/20194:43 PM

## 2018-02-14 ENCOUNTER — Ambulatory Visit (HOSPITAL_COMMUNITY): Payer: Medicare Other | Admitting: Psychiatry

## 2018-02-21 ENCOUNTER — Encounter: Payer: Medicare Other | Admitting: Family Medicine

## 2018-02-25 ENCOUNTER — Emergency Department (HOSPITAL_COMMUNITY)
Admission: EM | Admit: 2018-02-25 | Discharge: 2018-02-25 | Disposition: A | Discharge status: Home / Self Care | Payer: Medicare Other | Attending: Emergency Medicine | Admitting: Emergency Medicine

## 2018-02-25 ENCOUNTER — Other Ambulatory Visit: Payer: Self-pay

## 2018-02-25 ENCOUNTER — Encounter (HOSPITAL_COMMUNITY): Payer: Self-pay | Admitting: Emergency Medicine

## 2018-02-25 ENCOUNTER — Emergency Department (HOSPITAL_COMMUNITY): Payer: Medicare Other

## 2018-02-25 DIAGNOSIS — J069 Acute upper respiratory infection, unspecified: Secondary | ICD-10-CM

## 2018-02-25 DIAGNOSIS — I10 Essential (primary) hypertension: Secondary | ICD-10-CM | POA: Diagnosis not present

## 2018-02-25 DIAGNOSIS — J45909 Unspecified asthma, uncomplicated: Secondary | ICD-10-CM | POA: Insufficient documentation

## 2018-02-25 DIAGNOSIS — R0981 Nasal congestion: Secondary | ICD-10-CM | POA: Diagnosis present

## 2018-02-25 DIAGNOSIS — R05 Cough: Secondary | ICD-10-CM | POA: Diagnosis not present

## 2018-02-25 DIAGNOSIS — B9789 Other viral agents as the cause of diseases classified elsewhere: Secondary | ICD-10-CM

## 2018-02-25 MED ORDER — PREDNISONE 20 MG PO TABS: 40.0000 mg | ORAL_TABLET | Freq: Every day | ORAL | 0 refills | Status: AC

## 2018-02-25 MED ORDER — DIPHENHYDRAMINE HCL 25 MG PO TABS: 25.0000 mg | ORAL_TABLET | Freq: Four times a day (QID) | ORAL | 0 refills | Status: AC

## 2018-02-25 MED ORDER — PREDNISONE 20 MG PO TABS
40.0000 mg | ORAL_TABLET | Freq: Once | ORAL | Status: AC
  Administered 2018-02-25: 40 mg via ORAL
  Filled 2018-02-25: qty 2

## 2018-02-25 MED ORDER — IPRATROPIUM-ALBUTEROL 0.5-2.5 (3) MG/3ML IN SOLN
3.0000 mL | Freq: Once | RESPIRATORY_TRACT | Status: AC
  Administered 2018-02-25: 3 mL via RESPIRATORY_TRACT
  Filled 2018-02-25: qty 3

## 2018-02-25 MED ORDER — ALBUTEROL SULFATE HFA 108 (90 BASE) MCG/ACT IN AERS
2.0000 | INHALATION_SPRAY | Freq: Once | RESPIRATORY_TRACT | Status: AC
  Administered 2018-02-25: 2 via RESPIRATORY_TRACT
  Filled 2018-02-25: qty 6.7

## 2018-02-25 MED ORDER — ALBUTEROL SULFATE (2.5 MG/3ML) 0.083% IN NEBU
2.5000 mg | INHALATION_SOLUTION | Freq: Once | RESPIRATORY_TRACT | Status: AC
  Administered 2018-02-25: 2.5 mg via RESPIRATORY_TRACT
  Filled 2018-02-25: qty 3

## 2018-02-25 NOTE — ED Triage Notes (Signed)
Pt has multiple complaints including: cough, sore throat, runny nose, nasal congestion, and rash between toes.

## 2018-02-25 NOTE — ED Provider Notes (Signed)
[facility] EMERGENCY DEPARTMENT Provider Note   CSN: [id number] Arrival date & time: 02/25/18  1746     History   Chief Complaint Chief Complaint  Patient presents with  . Cough  . Nasal Congestion    HPI Thomas Holloway is a 47 y.o. male.  HPI   Thomas Holloway is a 47 y.o. male who presents to the Emergency Department complaining of nasal congestion, cough, runny nose and sore throat.  Symptoms have been present for 2-3 days.  He also reports sinus pressure and pain with intermittent frontal headaches.  He is tried over-the-counter cough and cold medications and regular Tylenol with minimal relief.  He states that his cough is mostly nonproductive and worse at night while trying to sleep.  He does report a history of asthma, but has ran out of his inhaler.  He denies fever, chest pain, shortness of breath, nausea vomiting, neck pain or stiffness and visual changes.  Past Medical History:  Diagnosis Date  . Ankle fracture   . Asthma   . Bronchitis   . Constipation   . GERD (gastroesophageal reflux disease)   . Hemorrhoid   . Hypertension     Patient Active Problem List   Diagnosis Date Noted  . Routine general medical examination at a health care facility 07/05/2012  . Hyperhidrosis 07/05/2012  . Blood in stool 07/05/2012  . Need for Tdap vaccination 07/05/2012  . Tinea cruris 07/05/2012    Past Surgical History:  Procedure Laterality Date  . ANKLE SURGERY     [facility]      Home Medications    Prior to Admission medications   Medication Sig Start Date End Date Taking? Authorizing Provider  albuterol (PROVENTIL HFA;VENTOLIN HFA) 108 (90 BASE) MCG/ACT inhaler Inhale 2 puffs into the lungs every 6 (six) hours as needed for wheezing or shortness of breath.    [provider]  hydrocortisone (ANUSOL-HC) 2.5 % rectal cream Apply rectally 2 times daily 02/02/18   Rancour, Jeannett SeniorStephen, MD  hydrocortisone 2.5 % cream Apply topically 2 (two) times  daily. Patient not taking: Reported on 01/12/2018 11/18/17   Mancel BaleWentz, Elliott, MD  lisinopril (PRINIVIL,ZESTRIL) 20 MG tablet Take 1 tablet (20 mg total) by mouth daily. Patient taking differently: Take 20 mg by mouth daily as needed.  01/12/18   Aliene BeamsHagler, Rachel, MD    Family History Family History  Problem Relation Age of Onset  . Stroke Mother   . Hypertension Father   . Diabetes Father   . Breast cancer Sister   . Alcohol abuse Neg Hx   . Heart disease Neg Hx   . Hyperlipidemia Neg Hx   . Kidney disease Neg Hx     Social History Social History   Tobacco Use  . Smoking status: Never Smoker  . Smokeless tobacco: Never Used  Substance Use Topics  . Alcohol use: No    Frequency: Never  . Drug use: No     Allergies   Fish allergy; Shellfish allergy; Garlic; Onion; and Pork-derived products   Review of Systems Review of Systems  Constitutional: Negative for activity change, appetite change, chills and fever.  HENT: Positive for congestion, rhinorrhea, sinus pressure, sinus pain and sore throat. Negative for facial swelling and trouble swallowing.   Eyes: Negative for visual disturbance.  Respiratory: Positive for cough. Negative for chest tightness, shortness of breath, wheezing and stridor.   Cardiovascular: Negative for chest pain.  Gastrointestinal: Negative for abdominal pain, nausea and  vomiting.  Genitourinary: Negative for dysuria and flank pain.  Musculoskeletal: Negative for arthralgias, neck pain and neck stiffness.  Skin: Negative for rash.  Neurological: Positive for headaches. Negative for dizziness, weakness and numbness.  Hematological: Negative for adenopathy.  Psychiatric/Behavioral: Negative for confusion.  All other systems reviewed and are negative.    Physical Exam Updated Vital Signs BP 133/85 (BP Location: Right Arm)   Pulse 81   Temp 97.8 F (36.6 C) (Oral)   Resp 20   Wt (!) 165.6 kg (365 lb)   SpO2 96%   BMI 53.90 kg/m   Physical  Exam  Constitutional: He is oriented to person, place, and time. He appears well-developed and well-nourished. No distress.  HENT:  Head: Normocephalic and atraumatic.  Right Ear: Tympanic membrane and ear canal normal.  Left Ear: Tympanic membrane and ear canal normal.  Nose: Mucosal edema and rhinorrhea present. Right sinus exhibits frontal sinus tenderness. Left sinus exhibits frontal sinus tenderness.  Mouth/Throat: Uvula is midline and mucous membranes are normal. No trismus in the jaw. No uvula swelling. Posterior oropharyngeal erythema present. No oropharyngeal exudate, posterior oropharyngeal edema or tonsillar abscesses.  Eyes: Conjunctivae are normal.  Neck: Normal range of motion, full passive range of motion without pain and phonation normal. Neck supple. No Brudzinski's sign and no Kernig's sign noted.  Cardiovascular: Normal rate, regular rhythm and intact distal pulses.  No murmur heard. Pulmonary/Chest: Effort normal and breath sounds normal. No respiratory distress. He has no wheezes. He has no rales.  Slightly diminished lung sounds bilaterally with few expiratory wheezes.  No respiratory distress noted.  Abdominal: Soft. He exhibits no distension. There is no tenderness. There is no rebound and no guarding.  Musculoskeletal: Normal range of motion. He exhibits no edema.  Lymphadenopathy:    He has no cervical adenopathy.  Neurological: He is alert and oriented to person, place, and time. He has normal strength. No sensory deficit. He exhibits normal muscle tone. Coordination and gait normal. GCS eye subscore is 4. GCS verbal subscore is 5. GCS motor subscore is 6.  CN II-XII intact.  No facial weakness, pronator drift, speech clear.    Skin: Skin is warm and dry. Capillary refill takes less than 2 seconds.  Psychiatric: He has a normal mood and affect.  Nursing note and vitals reviewed.    ED Treatments / Results  Labs (all labs ordered are listed, but only abnormal  results are displayed) Labs Reviewed - No data to display  EKG None  Radiology No results found.  Procedures Procedures (including critical care time)  Medications Ordered in ED Medications  ipratropium-albuterol (DUONEB) 0.5-2.5 (3) MG/3ML nebulizer solution 3 mL (has no administration in time range)  albuterol (PROVENTIL) (2.5 MG/3ML) 0.083% nebulizer solution 2.5 mg (has no administration in time range)  albuterol (PROVENTIL HFA;VENTOLIN HFA) 108 (90 Base) MCG/ACT inhaler 2 puff (has no administration in time range)  predniSONE (DELTASONE) tablet 40 mg (has no administration in time range)     Initial Impression / Assessment and Plan / ED Course  I have reviewed the triage vital signs and the nursing notes.  Pertinent labs & imaging results that were available during my care of the patient were reviewed by me and considered in my medical decision making (see chart for details).     Pt is well appearing.  Intermittent headaches, no focal neuro deficits.  Ambulates in the dept with steady gait. No nuchal rigidity.  On recheck, lung sounds improved after albuterol neb,  pt reports feeling better.  Sx's likely related to URI and bronchitis.  CXR neg for PNA.  Vitals reviewed.   Albuterol MDI dispensed for home use with instructions for use provided.  Pt agrees to tx plan and PCP f/u if needed.    Final Clinical Impressions(s) / ED Diagnoses   Final diagnoses:  Viral URI with cough    ED Discharge Orders    None       Pauline Aus, PA-C 02/25/18 2121    Derwood Kaplan, MD 02/26/18 1503

## 2018-02-25 NOTE — ED Notes (Signed)
Clear voiced, animated with multiple complaints of URI type sx

## 2018-02-25 NOTE — ED Notes (Signed)
Pt with report of seasonal allergies  Several day hx of cough, runny nose and sinus tenderness  Also reports  "fungus between toes"

## 2018-02-25 NOTE — Discharge Instructions (Addendum)
1-2 puffs of your albuterol inhaler every 4-6 hours as needed.  Start the prednisone prescription tomorrow.  Drink plenty of fluids.  You may take Tylenol or ibuprofen if needed for fever and/or body aches.  Follow-up with your primary doctor for recheck or return here for any worsening symptoms.

## 2018-03-09 ENCOUNTER — Ambulatory Visit (INDEPENDENT_AMBULATORY_CARE_PROVIDER_SITE_OTHER): Payer: Medicare Other | Admitting: Orthopaedic Surgery

## 2018-04-02 IMAGING — DX DG ABDOMEN 1V
2 series · 2 of 2 positions shown · non-contrast
Comparison: Abdominal radiograph dated 06/25/2014 and CT dated
03/31/2013

CLINICAL DATA: 46-year-old male with history of constipation and
hemorrhoids.

EXAM:
ABDOMEN - 1 VIEW

[abdomen kub (1 of 2)]
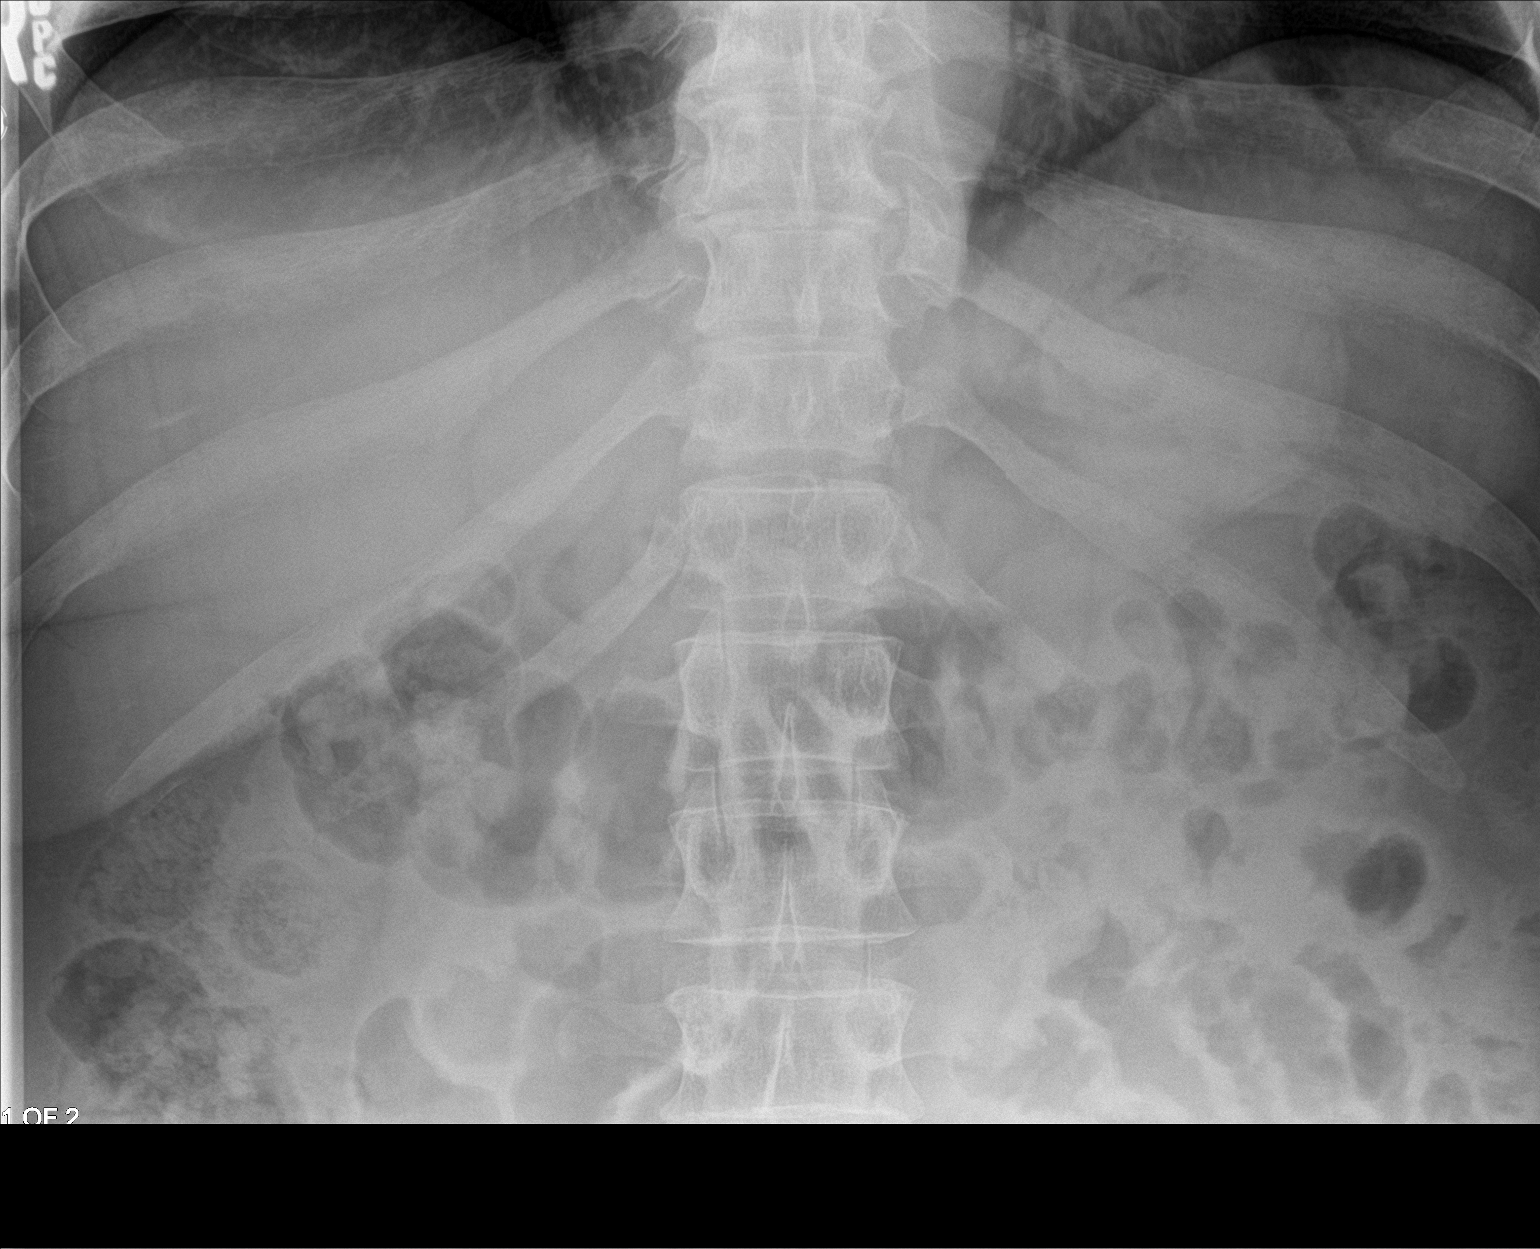

[abdomen kub (2 of 2)]
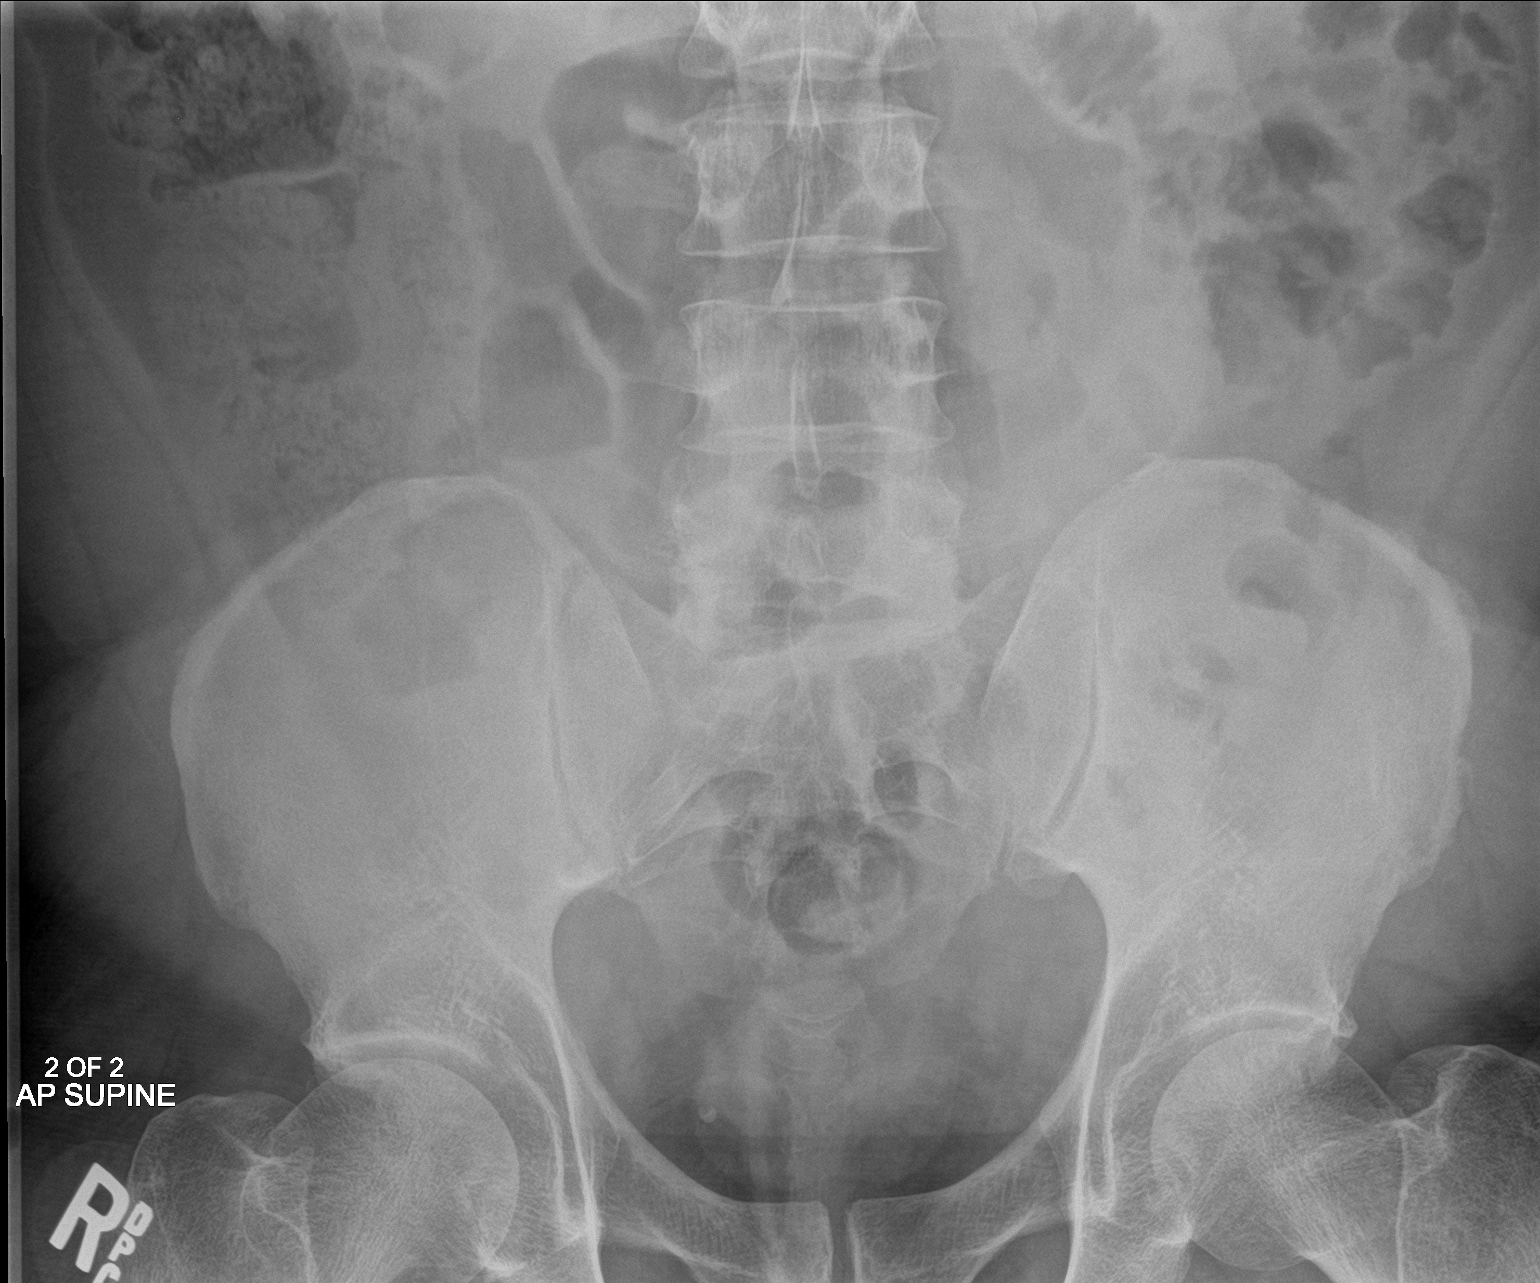

[2 of 2 positions shown; findings below may reference images not displayed]

FINDINGS: There is no bowel dilatation or evidence of obstruction. Moderate
stool noted throughout the colon. No free air or radiopaque calculi
noted. The visualized osseous structures and soft tissues appear
unremarkable.
IMPRESSION: No bowel obstruction.

## 2018-04-11 ENCOUNTER — Encounter: Payer: Medicare Other | Admitting: Family Medicine

## 2018-05-30 ENCOUNTER — Encounter: Payer: Medicare Other | Admitting: Family Medicine

## 2018-11-28 ENCOUNTER — Ambulatory Visit: Payer: Medicare Other | Admitting: Family Medicine

## 2019-04-17 ENCOUNTER — Telehealth: Payer: Self-pay
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
## 2019-05-01 ENCOUNTER — Telehealth: Payer: Self-pay

## 2019-05-01 NOTE — Telephone Encounter (Signed)
Left message for patient to schedule virtual visit for head injury.

## 2019-05-02 ENCOUNTER — Telehealth: Payer: Self-pay
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
## 2019-05-07 ENCOUNTER — Ambulatory Visit: Payer: Medicare HMO | Admitting: Family Medicine

## 2019-05-07 NOTE — Progress Notes (Signed)
Subjective:    Chief Complaint: Thomas Holloway, DOB: 18-Dec-1970, is a 48 y.o. male who presents for No chief complaint on file.  Patient was to have a virtual visit to discuss the possibility of postconcussive syndrome.  Attempted to review chart.  Did not have any background information.  Patient did not show up for the video conference.

## 2019-07-31 ENCOUNTER — Other Ambulatory Visit: Payer: Self-pay

## 2019-07-31 ENCOUNTER — Encounter (HOSPITAL_COMMUNITY): Payer: Self-pay | Admitting: Emergency Medicine

## 2019-07-31 ENCOUNTER — Emergency Department (HOSPITAL_COMMUNITY)
Admission: EM | Admit: 2019-07-31 | Discharge: 2019-08-01 | Disposition: A | Discharge status: Home / Self Care | Payer: Medicare HMO | Attending: Emergency Medicine | Admitting: Emergency Medicine

## 2019-07-31 DIAGNOSIS — J45909 Unspecified asthma, uncomplicated: Secondary | ICD-10-CM | POA: Insufficient documentation

## 2019-07-31 DIAGNOSIS — Z79899 Other long term (current) drug therapy: Secondary | ICD-10-CM | POA: Diagnosis not present

## 2019-07-31 DIAGNOSIS — F22 Delusional disorders: Secondary | ICD-10-CM | POA: Diagnosis not present

## 2019-07-31 DIAGNOSIS — I10 Essential (primary) hypertension: Secondary | ICD-10-CM | POA: Diagnosis not present

## 2019-07-31 HISTORY — DX: Anxiety disorder, unspecified: F41.9

## 2019-07-31 HISTORY — DX: Depression, unspecified: F32.A

## 2019-07-31 LAB — CBC WITH DIFFERENTIAL/PLATELET
Abs Immature Granulocytes: 0.06 10*3/uL (ref 0.00–0.07)
Basophils Absolute: 0.1 10*3/uL (ref 0.0–0.1)
Basophils Relative: 1 %
Eosinophils Absolute: 0.2 10*3/uL (ref 0.0–0.5)
Eosinophils Relative: 2 %
HCT: 45.5 % (ref 39.0–52.0)
Hemoglobin: 14.3 g/dL (ref 13.0–17.0)
Immature Granulocytes: 1 %
Lymphocytes Relative: 22 %
Lymphs Abs: 2.2 10*3/uL (ref 0.7–4.0)
MCH: 29.5 pg (ref 26.0–34.0)
MCHC: 31.4 g/dL (ref 30.0–36.0)
MCV: 93.8 fL (ref 80.0–100.0)
Monocytes Absolute: 0.8 10*3/uL (ref 0.1–1.0)
Monocytes Relative: 8 %
Neutro Abs: 6.7 10*3/uL (ref 1.7–7.7)
Neutrophils Relative %: 66 %
Platelets: 337 10*3/uL (ref 150–400)
RBC: 4.85 MIL/uL (ref 4.22–5.81)
RDW: 11.8 % (ref 11.5–15.5)
WBC: 9.9 10*3/uL (ref 4.0–10.5)
nRBC: 0 % (ref 0.0–0.2)

## 2019-07-31 LAB — COMPREHENSIVE METABOLIC PANEL
ALT: 18 U/L (ref 0–44)
AST: 18 U/L (ref 15–41)
Albumin: 4.2 g/dL (ref 3.5–5.0)
Alkaline Phosphatase: 73 U/L (ref 38–126)
Anion gap: 9 (ref 5–15)
BUN: 16 mg/dL (ref 6–20)
CO2: 23 mmol/L (ref 22–32)
Calcium: 9.2 mg/dL (ref 8.9–10.3)
Chloride: 107 mmol/L (ref 98–111)
Creatinine, Ser: 1.07 mg/dL (ref 0.61–1.24)
GFR calc Af Amer: >60 mL/min (ref >60)
GFR calc non Af Amer: >60 mL/min (ref >60)
Glucose, Bld: 105 mg/dL — ABNORMAL HIGH (ref 70–99)
Potassium: 3.8 mmol/L (ref 3.5–5.1)
Sodium: 139 mmol/L (ref 135–145)
Total Bilirubin: 0.8 mg/dL (ref 0.3–1.2)
Total Protein: 7.8 g/dL (ref 6.5–8.1)

## 2019-07-31 LAB — ETHANOL: Alcohol, Ethyl (B): <10 mg/dL (ref <10)

## 2019-07-31 MED ORDER — ONDANSETRON HCL 4 MG PO TABS: 4.0000 mg | ORAL_TABLET | Freq: Three times a day (TID) | ORAL | Status: DC | PRN

## 2019-07-31 MED ORDER — LORAZEPAM 1 MG PO TABS
1.0000 mg | ORAL_TABLET | Freq: Once | ORAL | Status: AC
  Administered 2019-08-01: 1 mg via ORAL
  Filled 2019-07-31: qty 1

## 2019-07-31 MED ORDER — ALBUTEROL SULFATE HFA 108 (90 BASE) MCG/ACT IN AERS: 2.0000 | INHALATION_SPRAY | Freq: Four times a day (QID) | RESPIRATORY_TRACT | Status: DC | PRN

## 2019-07-31 NOTE — ED Triage Notes (Signed)
Patient anxious at triage, was dropped off by police. States "people are following me and they followed me all the way from [place] and [place] and here so I had a panic attack and called the police. They said I need to stay here at the hospital so I'll be safe." Denies SI/HI.
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
## 2019-08-01 DIAGNOSIS — F22 Delusional disorders: Secondary | ICD-10-CM | POA: Diagnosis not present

## 2019-08-01 LAB — RAPID URINE DRUG SCREEN, HOSP PERFORMED
Amphetamines: NOT DETECTED
Barbiturates: NOT DETECTED
Benzodiazepines: NOT DETECTED
Cocaine: NOT DETECTED
Opiates: NOT DETECTED
Tetrahydrocannabinol: NOT DETECTED
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
## 2019-08-01 NOTE — Progress Notes (Signed)
Patient has been offered a bed at  [facility]  Accepting Provider: Dr.Cornwall   Bed: Main campus   RN Call for Report: [phone]   Patient may arrive at anytime.  Stephanie Acre, Alligator Social Worker [phone]

## 2019-08-01 NOTE — Progress Notes (Addendum)
This patient continues to meet inpatient criteria. CSW faxed information to the following facilities:   [facility] [place] [facility], [place] Worker

## 2019-08-01 NOTE — ED Provider Notes (Signed)
Blood pressure (!) 145/89, pulse 79, temperature 98.5 F (36.9 C), temperature source Oral, resp. rate 18, height 5\' 9"  (1.753 m), weight 131.5 kg, SpO2 100 %.  In short, Thomas Holloway is a 48 y.o. male with a chief complaint of V70.1 .  Refer to the original H&P for additional details.  10:45 AM  Patient accepted to [facility] by Dr. Selinda Flavin. EMTALA completed. Patient stable for transfer/admit.     Margette Fast, MD 08/01/19 1045

## 2021-07-27 ENCOUNTER — Encounter: Payer: Self-pay | Admitting: Gastroenterology
